# Patient Record
Sex: Male | Born: 1950 | Race: Black or African American | Hispanic: No | Marital: Married | Smoking: Former smoker
Health system: Southern US, Community
[De-identification: ages and names within clinical notes are randomized; demographics above are authoritative.]

## PROBLEM LIST (undated history)

## (undated) DIAGNOSIS — E7800 Pure hypercholesterolemia, unspecified: Secondary | ICD-10-CM

## (undated) DIAGNOSIS — E079 Disorder of thyroid, unspecified: Secondary | ICD-10-CM

## (undated) DIAGNOSIS — E119 Type 2 diabetes mellitus without complications: Secondary | ICD-10-CM

## (undated) DIAGNOSIS — M1990 Unspecified osteoarthritis, unspecified site: Secondary | ICD-10-CM

## (undated) DIAGNOSIS — Z550 Illiteracy and low-level literacy: Secondary | ICD-10-CM

## (undated) DIAGNOSIS — N138 Other obstructive and reflux uropathy: Secondary | ICD-10-CM

## (undated) DIAGNOSIS — I10 Essential (primary) hypertension: Secondary | ICD-10-CM

## (undated) DIAGNOSIS — R252 Cramp and spasm: Secondary | ICD-10-CM

## (undated) DIAGNOSIS — C61 Malignant neoplasm of prostate: Secondary | ICD-10-CM

## (undated) DIAGNOSIS — K219 Gastro-esophageal reflux disease without esophagitis: Secondary | ICD-10-CM

## (undated) DIAGNOSIS — N401 Enlarged prostate with lower urinary tract symptoms: Secondary | ICD-10-CM

## (undated) HISTORY — PX: CYST REMOVAL NECK: SHX6281

## (undated) HISTORY — PX: PROSTATE BIOPSY: SHX241

---

## 2001-06-04 ENCOUNTER — Ambulatory Visit (HOSPITAL_COMMUNITY): Admission: RE | Admit: 2001-06-04 | Discharge: 2001-06-04 | Payer: Self-pay | Admitting: *Deleted

## 2001-06-14 ENCOUNTER — Ambulatory Visit (HOSPITAL_COMMUNITY): Admission: RE | Admit: 2001-06-14 | Discharge: 2001-06-14 | Payer: Self-pay | Admitting: *Deleted

## 2001-06-24 ENCOUNTER — Ambulatory Visit (HOSPITAL_COMMUNITY): Admission: RE | Admit: 2001-06-24 | Discharge: 2001-06-24 | Payer: Self-pay | Admitting: *Deleted

## 2016-07-07 DIAGNOSIS — E119 Type 2 diabetes mellitus without complications: Secondary | ICD-10-CM | POA: Insufficient documentation

## 2016-11-07 ENCOUNTER — Emergency Department (HOSPITAL_COMMUNITY): Payer: Managed Care, Other (non HMO)

## 2016-11-07 ENCOUNTER — Encounter (HOSPITAL_COMMUNITY): Payer: Self-pay

## 2016-11-07 ENCOUNTER — Encounter (HOSPITAL_COMMUNITY): Admission: EM | Disposition: A | Discharge status: Home / Self Care | Payer: Self-pay | Source: Home / Self Care

## 2016-11-07 ENCOUNTER — Emergency Department (HOSPITAL_COMMUNITY): Payer: Managed Care, Other (non HMO) | Admitting: Registered Nurse

## 2016-11-07 ENCOUNTER — Inpatient Hospital Stay (HOSPITAL_COMMUNITY)
Admission: EM | Admit: 2016-11-07 | Discharge: 2016-11-14 | DRG: 339 | Disposition: A | Discharge status: Home / Self Care | Payer: Managed Care, Other (non HMO) | Attending: Surgery | Admitting: Surgery

## 2016-11-07 DIAGNOSIS — K429 Umbilical hernia without obstruction or gangrene: Secondary | ICD-10-CM | POA: Diagnosis present

## 2016-11-07 DIAGNOSIS — E7800 Pure hypercholesterolemia, unspecified: Secondary | ICD-10-CM | POA: Diagnosis present

## 2016-11-07 DIAGNOSIS — K353 Acute appendicitis with localized peritonitis, without perforation or gangrene: Secondary | ICD-10-CM

## 2016-11-07 DIAGNOSIS — K381 Appendicular concretions: Secondary | ICD-10-CM | POA: Diagnosis present

## 2016-11-07 DIAGNOSIS — K3532 Acute appendicitis with perforation and localized peritonitis, without abscess: Secondary | ICD-10-CM | POA: Diagnosis present

## 2016-11-07 DIAGNOSIS — R066 Hiccough: Secondary | ICD-10-CM | POA: Diagnosis not present

## 2016-11-07 DIAGNOSIS — Z79899 Other long term (current) drug therapy: Secondary | ICD-10-CM

## 2016-11-07 DIAGNOSIS — E119 Type 2 diabetes mellitus without complications: Secondary | ICD-10-CM | POA: Diagnosis present

## 2016-11-07 DIAGNOSIS — I10 Essential (primary) hypertension: Secondary | ICD-10-CM | POA: Diagnosis present

## 2016-11-07 DIAGNOSIS — Z87891 Personal history of nicotine dependence: Secondary | ICD-10-CM

## 2016-11-07 DIAGNOSIS — E039 Hypothyroidism, unspecified: Secondary | ICD-10-CM | POA: Diagnosis present

## 2016-11-07 DIAGNOSIS — K567 Ileus, unspecified: Secondary | ICD-10-CM | POA: Diagnosis not present

## 2016-11-07 HISTORY — DX: Pure hypercholesterolemia, unspecified: E78.00

## 2016-11-07 HISTORY — DX: Cramp and spasm: R25.2

## 2016-11-07 HISTORY — PX: LAPAROSCOPIC APPENDECTOMY: SHX408

## 2016-11-07 HISTORY — DX: Disorder of thyroid, unspecified: E07.9

## 2016-11-07 HISTORY — DX: Essential (primary) hypertension: I10

## 2016-11-07 LAB — CBC WITH DIFFERENTIAL/PLATELET
Basophils Absolute: 0 K/uL (ref 0.0–0.1)
Basophils Relative: 0 %
Eosinophils Absolute: 0 K/uL (ref 0.0–0.7)
Eosinophils Relative: 0 %
HCT: 45.6 % (ref 39.0–52.0)
Hemoglobin: 14.9 g/dL (ref 13.0–17.0)
Lymphocytes Relative: 9 %
Lymphs Abs: 1.5 K/uL (ref 0.7–4.0)
MCH: 29.3 pg (ref 26.0–34.0)
MCHC: 32.7 g/dL (ref 30.0–36.0)
MCV: 89.6 fL (ref 78.0–100.0)
Monocytes Absolute: 1.7 K/uL — ABNORMAL HIGH (ref 0.1–1.0)
Monocytes Relative: 10 %
Neutro Abs: 13.6 K/uL — ABNORMAL HIGH (ref 1.7–7.7)
Neutrophils Relative %: 81 %
Platelets: 286 K/uL (ref 150–400)
RBC: 5.09 MIL/uL (ref 4.22–5.81)
RDW: 14.5 % (ref 11.5–15.5)
WBC: 16.9 K/uL — ABNORMAL HIGH (ref 4.0–10.5)

## 2016-11-07 LAB — COMPREHENSIVE METABOLIC PANEL WITH GFR
ALT: 305 U/L — ABNORMAL HIGH (ref 17–63)
AST: 91 U/L — ABNORMAL HIGH (ref 15–41)
Albumin: 4.6 g/dL (ref 3.5–5.0)
Alkaline Phosphatase: 64 U/L (ref 38–126)
Anion gap: 7 (ref 5–15)
BUN: 16 mg/dL (ref 6–20)
CO2: 31 mmol/L (ref 22–32)
Calcium: 9.1 mg/dL (ref 8.9–10.3)
Chloride: 103 mmol/L (ref 101–111)
Creatinine, Ser: 1.01 mg/dL (ref 0.61–1.24)
GFR calc Af Amer: >60 mL/min (ref >60)
GFR calc non Af Amer: >60 mL/min (ref >60)
Glucose, Bld: 155 mg/dL — ABNORMAL HIGH (ref 65–99)
Potassium: 3.8 mmol/L (ref 3.5–5.1)
Sodium: 141 mmol/L (ref 135–145)
Total Bilirubin: 0.8 mg/dL (ref 0.3–1.2)
Total Protein: 7.6 g/dL (ref 6.5–8.1)

## 2016-11-07 LAB — URINALYSIS, ROUTINE W REFLEX MICROSCOPIC
Bacteria, UA: NONE SEEN
Bilirubin Urine: NEGATIVE
Glucose, UA: 50 mg/dL — AB
Ketones, ur: NEGATIVE mg/dL
Leukocytes, UA: NEGATIVE
Nitrite: NEGATIVE
Protein, ur: NEGATIVE mg/dL
Specific Gravity, Urine: 1.019 (ref 1.005–1.030)
pH: 6 (ref 5.0–8.0)

## 2016-11-07 LAB — LIPASE, BLOOD: LIPASE: 14 U/L (ref 11–51)

## 2016-11-07 LAB — TROPONIN I

## 2016-11-07 SURGERY — APPENDECTOMY, LAPAROSCOPIC
Anesthesia: General

## 2016-11-07 MED ORDER — LIDOCAINE 2% (20 MG/ML) 5 ML SYRINGE
INTRAMUSCULAR | Status: DC | PRN
  Administered 2016-11-07: 40 mg via INTRAVENOUS

## 2016-11-07 MED ORDER — DEXAMETHASONE SODIUM PHOSPHATE 10 MG/ML IJ SOLN
INTRAMUSCULAR | Status: AC
  Filled 2016-11-07: qty 1

## 2016-11-07 MED ORDER — LIDOCAINE 2% (20 MG/ML) 5 ML SYRINGE
INTRAMUSCULAR | Status: AC
  Filled 2016-11-07: qty 5

## 2016-11-07 MED ORDER — SUGAMMADEX SODIUM 200 MG/2ML IV SOLN
INTRAVENOUS | Status: DC | PRN
  Administered 2016-11-07: 200 mg via INTRAVENOUS

## 2016-11-07 MED ORDER — DICYCLOMINE HCL 20 MG PO TABS: 20.0000 mg | ORAL_TABLET | Freq: Two times a day (BID) | ORAL | 0 refills | Status: DC

## 2016-11-07 MED ORDER — ONDANSETRON HCL 4 MG/2ML IJ SOLN
4.0000 mg | Freq: Once | INTRAMUSCULAR | Status: AC
  Administered 2016-11-07: 4 mg via INTRAVENOUS
  Filled 2016-11-07: qty 2

## 2016-11-07 MED ORDER — IOPAMIDOL (ISOVUE-300) INJECTION 61%
100.0000 mL | Freq: Once | INTRAVENOUS | Status: AC | PRN
  Administered 2016-11-07: 100 mL via INTRAVENOUS

## 2016-11-07 MED ORDER — ONDANSETRON HCL 4 MG/2ML IJ SOLN
INTRAMUSCULAR | Status: DC | PRN
  Administered 2016-11-07: 4 mg via INTRAVENOUS

## 2016-11-07 MED ORDER — MORPHINE SULFATE (PF) 4 MG/ML IV SOLN
4.0000 mg | Freq: Once | INTRAVENOUS | Status: AC
  Administered 2016-11-07: 4 mg via INTRAVENOUS
  Filled 2016-11-07: qty 1

## 2016-11-07 MED ORDER — MIDAZOLAM HCL 2 MG/2ML IJ SOLN
INTRAMUSCULAR | Status: AC
  Filled 2016-11-07: qty 2

## 2016-11-07 MED ORDER — ONDANSETRON HCL 4 MG/2ML IJ SOLN
4.0000 mg | Freq: Four times a day (QID) | INTRAMUSCULAR | Status: DC | PRN
  Administered 2016-11-08 – 2016-11-11 (×3): 4 mg via INTRAVENOUS
  Filled 2016-11-07 (×3): qty 2

## 2016-11-07 MED ORDER — SUGAMMADEX SODIUM 200 MG/2ML IV SOLN
INTRAVENOUS | Status: AC
  Filled 2016-11-07: qty 2

## 2016-11-07 MED ORDER — IOPAMIDOL (ISOVUE-300) INJECTION 61%
INTRAVENOUS | Status: AC
  Filled 2016-11-07: qty 100

## 2016-11-07 MED ORDER — HYDROMORPHONE HCL 1 MG/ML IJ SOLN
0.2500 mg | INTRAMUSCULAR | Status: DC | PRN
Start: 2016-11-07 — End: 2016-11-07
  Administered 2016-11-07 (×3): 0.5 mg via INTRAVENOUS

## 2016-11-07 MED ORDER — FENTANYL CITRATE (PF) 100 MCG/2ML IJ SOLN
INTRAMUSCULAR | Status: AC
  Filled 2016-11-07: qty 2

## 2016-11-07 MED ORDER — MORPHINE SULFATE (PF) 2 MG/ML IV SOLN
1.0000 mg | INTRAVENOUS | Status: DC | PRN
  Administered 2016-11-08 – 2016-11-10 (×4): 1 mg via INTRAVENOUS
  Filled 2016-11-07 (×5): qty 1

## 2016-11-07 MED ORDER — HYDRALAZINE HCL 20 MG/ML IJ SOLN: 10.0000 mg | INTRAMUSCULAR | Status: DC | PRN

## 2016-11-07 MED ORDER — PIPERACILLIN-TAZOBACTAM 3.375 G IVPB
3.3750 g | Freq: Once | INTRAVENOUS | Status: AC
  Administered 2016-11-07: 3.375 g via INTRAVENOUS
  Filled 2016-11-07: qty 50

## 2016-11-07 MED ORDER — PROPOFOL 10 MG/ML IV BOLUS
INTRAVENOUS | Status: AC
  Filled 2016-11-07: qty 20

## 2016-11-07 MED ORDER — LACTATED RINGERS IV SOLN
INTRAVENOUS | Status: DC
  Administered 2016-11-07: 16:00:00 via INTRAVENOUS

## 2016-11-07 MED ORDER — BUPIVACAINE HCL 0.25 % IJ SOLN
INTRAMUSCULAR | Status: DC | PRN
  Administered 2016-11-07: 10 mL

## 2016-11-07 MED ORDER — KCL IN DEXTROSE-NACL 20-5-0.45 MEQ/L-%-% IV SOLN
INTRAVENOUS | Status: DC
  Administered 2016-11-07 – 2016-11-09 (×2): via INTRAVENOUS
  Filled 2016-11-07 (×4): qty 1000

## 2016-11-07 MED ORDER — SODIUM CHLORIDE 0.9 % IV SOLN
1000.0000 mL | Freq: Once | INTRAVENOUS | Status: AC
  Administered 2016-11-07: 1000 mL via INTRAVENOUS

## 2016-11-07 MED ORDER — FENTANYL CITRATE (PF) 100 MCG/2ML IJ SOLN
INTRAMUSCULAR | Status: DC | PRN
  Administered 2016-11-07: 100 ug via INTRAVENOUS

## 2016-11-07 MED ORDER — PANTOPRAZOLE SODIUM 40 MG IV SOLR
40.0000 mg | Freq: Every day | INTRAVENOUS | Status: DC
  Administered 2016-11-07 – 2016-11-12 (×6): 40 mg via INTRAVENOUS
  Filled 2016-11-07 (×6): qty 40

## 2016-11-07 MED ORDER — ROCURONIUM BROMIDE 10 MG/ML (PF) SYRINGE
PREFILLED_SYRINGE | INTRAVENOUS | Status: DC | PRN
  Administered 2016-11-07: 40 mg via INTRAVENOUS

## 2016-11-07 MED ORDER — PROPOFOL 10 MG/ML IV BOLUS
INTRAVENOUS | Status: DC | PRN
Start: 2016-11-07 — End: 2016-11-07
  Administered 2016-11-07: 150 mg via INTRAVENOUS

## 2016-11-07 MED ORDER — PIPERACILLIN-TAZOBACTAM 3.375 G IVPB: 3.3750 g | Freq: Three times a day (TID) | INTRAVENOUS | Status: DC

## 2016-11-07 MED ORDER — SODIUM CHLORIDE 0.9 % IJ SOLN
INTRAMUSCULAR | Status: AC
  Filled 2016-11-07: qty 50

## 2016-11-07 MED ORDER — ONDANSETRON HCL 4 MG/2ML IJ SOLN
INTRAMUSCULAR | Status: AC
  Filled 2016-11-07: qty 2

## 2016-11-07 MED ORDER — PIPERACILLIN-TAZOBACTAM 3.375 G IVPB
3.3750 g | Freq: Three times a day (TID) | INTRAVENOUS | Status: DC
  Administered 2016-11-07 – 2016-11-14 (×20): 3.375 g via INTRAVENOUS
  Filled 2016-11-07 (×20): qty 50

## 2016-11-07 MED ORDER — ONDANSETRON 4 MG PO TBDP: 4.0000 mg | ORAL_TABLET | Freq: Four times a day (QID) | ORAL | Status: DC | PRN

## 2016-11-07 MED ORDER — SUCCINYLCHOLINE CHLORIDE 200 MG/10ML IV SOSY
PREFILLED_SYRINGE | INTRAVENOUS | Status: DC | PRN
  Administered 2016-11-07: 120 mg via INTRAVENOUS

## 2016-11-07 MED ORDER — HEPARIN SODIUM (PORCINE) 5000 UNIT/ML IJ SOLN
5000.0000 [IU] | Freq: Three times a day (TID) | INTRAMUSCULAR | Status: DC
  Administered 2016-11-08 – 2016-11-14 (×19): 5000 [IU] via SUBCUTANEOUS
  Filled 2016-11-07 (×18): qty 1

## 2016-11-07 MED ORDER — HYDROMORPHONE HCL 2 MG/ML IJ SOLN
INTRAMUSCULAR | Status: AC
  Filled 2016-11-07: qty 1

## 2016-11-07 MED ORDER — MIDAZOLAM HCL 5 MG/5ML IJ SOLN
INTRAMUSCULAR | Status: DC | PRN
  Administered 2016-11-07: 2 mg via INTRAVENOUS

## 2016-11-07 MED ORDER — DEXAMETHASONE SODIUM PHOSPHATE 10 MG/ML IJ SOLN
INTRAMUSCULAR | Status: DC | PRN
  Administered 2016-11-07: 10 mg via INTRAVENOUS

## 2016-11-07 MED ORDER — ONDANSETRON HCL 4 MG PO TABS: 4.0000 mg | ORAL_TABLET | Freq: Four times a day (QID) | ORAL | 0 refills | Status: DC

## 2016-11-07 MED ORDER — ROCURONIUM BROMIDE 50 MG/5ML IV SOSY
PREFILLED_SYRINGE | INTRAVENOUS | Status: AC
  Filled 2016-11-07: qty 5

## 2016-11-07 MED ORDER — SUCCINYLCHOLINE CHLORIDE 200 MG/10ML IV SOSY
PREFILLED_SYRINGE | INTRAVENOUS | Status: AC
  Filled 2016-11-07: qty 10

## 2016-11-07 SURGICAL SUPPLY — 41 items
APPLIER CLIP ROT 10 11.4 M/L (STAPLE)
CABLE HIGH FREQUENCY MONO STRZ (ELECTRODE) ×3 IMPLANT
CHLORAPREP W/TINT 26ML (MISCELLANEOUS) ×3 IMPLANT
CLIP APPLIE ROT 10 11.4 M/L (STAPLE) IMPLANT
COVER SURGICAL LIGHT HANDLE (MISCELLANEOUS) ×3 IMPLANT
CUTTER FLEX LINEAR 45M (STAPLE) ×3 IMPLANT
DECANTER SPIKE VIAL GLASS SM (MISCELLANEOUS) ×3 IMPLANT
DERMABOND ADVANCED (GAUZE/BANDAGES/DRESSINGS) ×2
DERMABOND ADVANCED .7 DNX12 (GAUZE/BANDAGES/DRESSINGS) ×1 IMPLANT
DRAIN CHANNEL 19F RND (DRAIN) ×3 IMPLANT
DRAPE LAPAROSCOPIC ABDOMINAL (DRAPES) ×3 IMPLANT
ELECT REM PT RETURN 9FT ADLT (ELECTROSURGICAL) ×3
ELECTRODE REM PT RTRN 9FT ADLT (ELECTROSURGICAL) ×1 IMPLANT
ENDOLOOP SUT PDS II  0 18 (SUTURE)
ENDOLOOP SUT PDS II 0 18 (SUTURE) IMPLANT
EVACUATOR SILICONE 100CC (DRAIN) ×3 IMPLANT
GLOVE BIOGEL M 8.0 STRL (GLOVE) ×3 IMPLANT
GOWN STRL REUS W/TWL XL LVL3 (GOWN DISPOSABLE) ×6 IMPLANT
IRRIG SUCT STRYKERFLOW 2 WTIP (MISCELLANEOUS) ×3
IRRIGATION SUCT STRKRFLW 2 WTP (MISCELLANEOUS) ×1 IMPLANT
KIT BASIN OR (CUSTOM PROCEDURE TRAY) ×3 IMPLANT
POUCH RETRIEVAL ECOSAC 10 (ENDOMECHANICALS) IMPLANT
POUCH RETRIEVAL ECOSAC 10MM (ENDOMECHANICALS)
POUCH SPECIMEN RETRIEVAL 10MM (ENDOMECHANICALS) ×6 IMPLANT
RELOAD 45 VASCULAR/THIN (ENDOMECHANICALS) IMPLANT
RELOAD STAPLE TA45 3.5 REG BLU (ENDOMECHANICALS) ×3 IMPLANT
SCISSORS LAP 5X45 EPIX DISP (ENDOMECHANICALS) IMPLANT
SHEARS HARMONIC ACE PLUS 45CM (MISCELLANEOUS) ×3 IMPLANT
SLEEVE XCEL OPT CAN 5 100 (ENDOMECHANICALS) IMPLANT
STAPLER VISISTAT 35W (STAPLE) ×3 IMPLANT
SUT NOVA NAB GS-21 1 T12 (SUTURE) ×3 IMPLANT
SUT NYLON 3 0 (SUTURE) ×3 IMPLANT
SUT VIC AB 4-0 SH 18 (SUTURE) ×3 IMPLANT
TOWEL OR 17X26 10 PK STRL BLUE (TOWEL DISPOSABLE) ×3 IMPLANT
TRAY FOLEY W/METER SILVER 14FR (SET/KITS/TRAYS/PACK) IMPLANT
TRAY FOLEY W/METER SILVER 16FR (SET/KITS/TRAYS/PACK) ×3 IMPLANT
TRAY LAPAROSCOPIC (CUSTOM PROCEDURE TRAY) ×3 IMPLANT
TROCAR BLADELESS OPT 5 100 (ENDOMECHANICALS) ×3 IMPLANT
TROCAR XCEL BLUNT TIP 100MML (ENDOMECHANICALS) ×3 IMPLANT
TROCAR XCEL NON-BLD 11X100MML (ENDOMECHANICALS) IMPLANT
TUBING INSUF HEATED (TUBING) ×3 IMPLANT

## 2016-11-07 NOTE — Anesthesia Postprocedure Evaluation (Signed)
Anesthesia Post Note  Patient: Melvin Warner  Procedure(s) Performed: Procedure(s) (LRB): APPENDECTOMY LAPAROSCOPIC (N/A)  Patient location during evaluation: PACU Anesthesia Type: General Level of consciousness: awake Pain management: pain level controlled Respiratory status: spontaneous breathing Cardiovascular status: stable Anesthetic complications: no    Last Vitals:  Vitals:   11/07/16 1830 11/07/16 1845  BP: 108/63 108/62  Pulse: 89 88  Resp: (!) 22 17  Temp:      Last Pain:  Vitals:   11/07/16 1805  TempSrc:   PainSc: Asleep                 EDWARDS,Elva Breaker

## 2016-11-07 NOTE — H&P (Signed)
[facility] Admission Note  Aleem E Glab 07/19/1951  [id number].    Requesting MD: Haviland, MD Chief Complaint/Reason for Consult: acute appendicitis   HPI:  Mr. Blixt is a 65 y/o male with HTN, HLD, and hypothyroidism who presented to [facility] with lower abdominal pain. Patient states the pain started last night and has gradually worsened and localized to his right lower abdomen, causing him to call EMS this morning. Pain described as aching, constant, and non-radiating. Patient reports when the pain started he thought It may have been a pulled muscle from mopping floors. He tried taking some OTC stool softeners with no relief of symptoms. Associated symptoms include nausea and one episode of watery, non-bloody emesis.   Patient denies a PMH MI or CVA. Patient reports that he has been diagnosed with Diabetes but does not take medications, he is managing the disease with carb-modified diet. Has no PMH abdominal surgeries. Denies use of blood thinners. Last meal was yesterday. Has a history of cyst removal from his neck many years ago, during which time he reports "waking up" and sitting up during the operation. He denies alcohol use - used to drink beer regularly but has not had a drink in 4 years. Patient is employed by [employer].  ED workup: WBC 16.9, CT significant for acute appendicitis w/ appendicolith without rupture/abscess, troponin negative, UA negative, AST 91, ALT 305 (no biliary abnormalities on CT scan, suspect related to reported history alcohol overuse, AST to ALT ratio > 2:1)   ROS: Review of Systems  Constitutional: Negative for chills and fever.  HENT: Negative for congestion.   Respiratory: Negative for hemoptysis and shortness of breath.   Cardiovascular: Negative for chest pain and palpitations.  Gastrointestinal: Positive for abdominal pain, nausea and vomiting. Negative for blood in stool, constipation, diarrhea, heartburn and melena.  Genitourinary:  Negative for dysuria and hematuria.  Neurological: Negative for weakness and headaches.     History reviewed. No pertinent family history.  Past Medical History:  Diagnosis Date  . High cholesterol   . Hypertension   . Leg cramps   . Thyroid disease     Past Surgical History:  Procedure Laterality Date  . CYST REMOVAL NECK      Social History:  reports that he has quit smoking. His smoking use included Cigarettes. He has never used smokeless tobacco. He reports that he does not drink alcohol or use drugs.  Allergies: No Known Allergies   (Not in a hospital admission)  Blood pressure 141/76, pulse 104, temperature 98.6 F (37 C), temperature source Oral, resp. rate 20, height 5' 8" (1.727 m), weight 175 lb (79.4 kg), SpO2 95 %. Physical Exam: General: pleasant, WD/WN AA male who is laying in bed in NAD HEENT: head is normocephalic, atraumatic. Mouth is pink and dry Heart: regular, rate, and rhythm.  No obvious murmurs, gallops, or rubs noted.   Lungs: CTAB, no wheezes, rhonchi, or rales noted.  Respiratory effort nonlabored Abd: soft, TTP of right lower abdomen with local peritonitis and positive Rovsing's sign, +BS, reducible umbilical hernia is present MS: all 4 extremities are symmetrical with no cyanosis, clubbing, or edema. Skin: warm and dry with no masses Psych: A&Ox3 with an appropriate affect. Neuro: grossly intact, normal speech  Results for orders placed or performed during the hospital encounter of 11/07/16 (from the past 48 hour(s))  CBC with Differential     Status: Abnormal   Collection Time: 11/07/16  8:44 AM  Result Value Ref Range     WBC 16.9 (H) 4.0 - 10.5 K/uL   RBC 5.09 4.22 - 5.81 MIL/uL   Hemoglobin 14.9 13.0 - 17.0 g/dL   HCT 45.6 39.0 - 52.0 %   MCV 89.6 78.0 - 100.0 fL   MCH 29.3 26.0 - 34.0 pg   MCHC 32.7 30.0 - 36.0 g/dL   RDW 14.5 11.5 - 15.5 %   Platelets 286 150 - 400 K/uL   Neutrophils Relative % 81 %   Neutro Abs 13.6 (H) 1.7 - 7.7  K/uL   Lymphocytes Relative 9 %   Lymphs Abs 1.5 0.7 - 4.0 K/uL   Monocytes Relative 10 %   Monocytes Absolute 1.7 (H) 0.1 - 1.0 K/uL   Eosinophils Relative 0 %   Eosinophils Absolute 0.0 0.0 - 0.7 K/uL   Basophils Relative 0 %   Basophils Absolute 0.0 0.0 - 0.1 K/uL  Comprehensive metabolic panel     Status: Abnormal   Collection Time: 11/07/16  8:44 AM  Result Value Ref Range   Sodium 141 135 - 145 mmol/L   Potassium 3.8 3.5 - 5.1 mmol/L   Chloride 103 101 - 111 mmol/L   CO2 31 22 - 32 mmol/L   Glucose, Bld 155 (H) 65 - 99 mg/dL   BUN 16 6 - 20 mg/dL   Creatinine, Ser 1.01 0.61 - 1.24 mg/dL   Calcium 9.1 8.9 - 10.3 mg/dL   Total Protein 7.6 6.5 - 8.1 g/dL   Albumin 4.6 3.5 - 5.0 g/dL   AST 91 (H) 15 - 41 U/L   ALT 305 (H) 17 - 63 U/L   Alkaline Phosphatase 64 38 - 126 U/L   Total Bilirubin 0.8 0.3 - 1.2 mg/dL   GFR calc non Af Amer >60 >60 mL/min   GFR calc Af Amer >60 >60 mL/min    Comment: (NOTE) The eGFR has been calculated using the CKD EPI equation. This calculation has not been validated in all clinical situations. eGFR's persistently <60 mL/min signify possible Chronic Kidney Disease.    Anion gap 7 5 - 15  Lipase, blood     Status: None   Collection Time: 11/07/16  8:44 AM  Result Value Ref Range   Lipase 14 11 - 51 U/L  Troponin I     Status: None   Collection Time: 11/07/16  8:44 AM  Result Value Ref Range   Troponin I <0.03 <0.03 ng/mL  Urinalysis, Routine w reflex microscopic     Status: Abnormal   Collection Time: 11/07/16  8:44 AM  Result Value Ref Range   Color, Urine YELLOW YELLOW   APPearance CLEAR CLEAR   Specific Gravity, Urine 1.019 1.005 - 1.030   pH 6.0 5.0 - 8.0   Glucose, UA 50 (A) NEGATIVE mg/dL   Hgb urine dipstick SMALL (A) NEGATIVE   Bilirubin Urine NEGATIVE NEGATIVE   Ketones, ur NEGATIVE NEGATIVE mg/dL   Protein, ur NEGATIVE NEGATIVE mg/dL   Nitrite NEGATIVE NEGATIVE   Leukocytes, UA NEGATIVE NEGATIVE   RBC / HPF 6-30 0 - 5  RBC/hpf   WBC, UA 0-5 0 - 5 WBC/hpf   Bacteria, UA NONE SEEN NONE SEEN   Squamous Epithelial / LPF 0-5 (A) NONE SEEN   Mucous PRESENT    Ct Abdomen Pelvis W Contrast  Result Date: 11/07/2016 CLINICAL DATA:  65-year-old male with lower abdominal pain and cramping since last night. Abdominal distension. Initial encounter. EXAM: CT ABDOMEN AND PELVIS WITH CONTRAST TECHNIQUE: Multidetector CT imaging of the abdomen and   pelvis was performed using the standard protocol following bolus administration of intravenous contrast. CONTRAST:  100mL ISOVUE-300 IOPAMIDOL (ISOVUE-300) INJECTION 61% COMPARISON:  None. FINDINGS: Lower chest: Mild lung base atelectasis. No pericardial or pleural effusion. No free air in the upper abdomen. Hepatobiliary: Negative. Pancreas: Negative. Spleen: Negative. Adrenals/Urinary Tract: Negative adrenal glands. Normal bilateral renal enhancement and contrast excretion. Unremarkable urinary bladder. Stomach/Bowel: Negative rectum. Negative distal sigmoid colon. Mesenteric inflammation in the region of the proximal sigmoid with no direct involvement (see right colon findings below). Mild sigmoid redundancy. Negative left colon and transverse colon aside from retained stool. Gas and retained stool in the right colon. The cecum is dilated up to 8 cm. There is an 8-9 mm appendicolith at the orifice of the appendix from the cecum (series 2, image 53 and coronal image 58). The appendix is then severely dilated to 17 mm diameter and inflamed throughout its course with surrounding mesenteric stranding. The appendix is relatively situated in the midline. There is probably a second 8-9 mm appendicolith in the midportion of the appendix (coronal image 70). No extraluminal gas identified. The terminal ileum is fluid-filled but not significantly dilated and otherwise unaffected. No discrete free fluid. No dilated upstream small bowel. Fairly decompressed stomach. Negative duodenum. Vascular/Lymphatic:  Aortoiliac calcified atherosclerosis noted. Major arterial structures are patent. Portal venous system is patent. No lymphadenopathy; maximal nonspecific gastrohepatic ligament nodes measuring 8 mm short axis. Reproductive: Negative. Other: No pelvic free fluid. Musculoskeletal: Lower lumbar disc, endplate, and facet degeneration. No acute osseous abnormality identified. IMPRESSION: 1. Positive for Acute Appendicitis. Dilated and inflamed appendix with multiple 8-9 mm appendicolith thus. No abscess. No evidence of rupture at this time. 2.  Calcified aortic atherosclerosis. Electronically Signed   By: H  Hall M.D.   On: 11/07/2016 10:35      Assessment/Plan Acute appendicitis without perforation or abscess Leukocytosis  Admit to [facility] service   NPO, IVF, antiemeics, analgesics  WBC 16.9 - IV Zosyn  OR tonight for laparoscopic appendectomy by Dr. Matthew Martin  Abnormal AST/ALT - will discuss role for checking hepatitis panel with MD  Hypertension - hold lisinopril-HCTZ Hyperlipidemia - hold atorvastatin Hypothyroidism - hold synthroid  Dispo: OR for laparoscopic appendectomy Resume home meds post-operatively  Elizabeth S Simaan, PA-C [facility] 11/07/2016, 2:56 PM Pager: [phone] Consults: [phone] Mon-Fri 7:00 am-4:30 pm Sat-Sun 7:00 am-11:30 am  

## 2016-11-07 NOTE — ED Notes (Signed)
Dentures (upper and lower), glasses, and cell phone in security lock box.

## 2016-11-07 NOTE — Op Note (Signed)
Surgeon: Kaylyn Lim, MD, FACS  Asst:  None  Anes:  Gen. endotracheal  Procedure: Laparoscopic appendectomy and primary closure of umbilical hernia  Diagnosis: Perforated appendicitis with fecal contamination and purulent peritonitis  Complications: None  EBL:   8 cc  Drains: 19 Blake drain to right upper quadrant incision  Description of Procedure:  The patient was taken to OR two  at [facility].  After anesthesia was administered and the patient was prepped a timeout was performed.  The patient had a quarter-sized umbilical hernia through which I made my [place] port. This was closed primarily at the end of the case with interrupted #1 Novafils. Upon making the initial incision in the skin yellow pus spewed out of the little hole indicating that he had a perforation with peritonitis. He Sheryle Hail was inserted and the abdomen was insufflated.  The patient had an  appendicolith had ruptured resulting in free feculent stool balls in the right lower quadrant. These were after my 2 ports were placed these were retrieved and placed into a bag and also the 10 mm "pooper" scooper was used to extract remainders.  I was able to divide the mesentery with the Harmonic scalpel and then transected the base with a stapler using a blue load. The appendix was placed in Endobag brought to the umbilicus. A 19 Blake drain was brought into the right side 5 mm port placed in the pelvis. The umbilical incision was closed with #1 Novafils and then the abdomen was surveyed again with the 5 mm scope to the left lower quadrant. Port sites were injected with Marcaine and the umbilicus was closed with 4-0 Vicryl as was the right of the left lower quadrant trocar site.  The patient tolerated the procedure well and was taken to the PACU in stable condition.     Matt B. Hassell Done, [place], [facility], [place]

## 2016-11-07 NOTE — ED Provider Notes (Addendum)
[facility] DEPT Provider Note   CSN: [id number] Arrival date & time: 11/07/16  [id number]     History   Chief Complaint Chief Complaint  Patient presents with  . Abdominal Pain  . Constipation    HPI Melvin Warner is a 65 y.o. male.  Pt presents to the ED this morning with bilateral lower abdominal pain that started last night.  He has not had any n/v.  He has been having bowel movements.  The pt feels like his abdomen is full.  He was up all last night with the pain.      Past Medical History:  Diagnosis Date  . High cholesterol   . Hypertension   . Leg cramps   . Thyroid disease     There are no active problems to display for this patient.   Past Surgical History:  Procedure Laterality Date  . CYST REMOVAL NECK         Home Medications    Prior to Admission medications   Medication Sig Start Date End Date Taking? Authorizing Provider  atorvastatin (LIPITOR) 20 MG tablet Take 20 mg by mouth daily.   Yes Historical Provider, MD  diclofenac (VOLTAREN) 75 MG EC tablet Take 75 mg by mouth 2 (two) times daily as needed for moderate pain.   Yes Historical Provider, MD  levothyroxine (SYNTHROID, LEVOTHROID) 100 MCG tablet Take 100 mcg by mouth daily before breakfast.   Yes Historical Provider, MD  lisinopril-hydrochlorothiazide (PRINZIDE,ZESTORETIC) 20-12.5 MG tablet Take 1 tablet by mouth daily.   Yes Historical Provider, MD  Multiple Vitamins-Minerals (MENS ONE DAILY PO) Take 1 tablet by mouth daily.   Yes Historical Provider, MD  nystatin cream (MYCOSTATIN) Apply 1 application topically 2 (two) times daily as needed for dry skin.   Yes Historical Provider, MD  oxymetazoline (AFRIN) 0.05 % nasal spray Place 1 spray into both nostrils 2 (two) times daily as needed for congestion.   Yes Historical Provider, MD    Family History History reviewed. No pertinent family history.  Social History Social History  Substance Use Topics  . Smoking status: Former Smoker     Types: Cigarettes  . Smokeless tobacco: Never Used  . Alcohol use No     Allergies   Patient has no known allergies.   Review of Systems Review of Systems  Gastrointestinal: Positive for abdominal distention and abdominal pain.  All other systems reviewed and are negative.    Physical Exam Updated Vital Signs BP (!) 122/53   Pulse 98   Temp 98.6 F (37 C) (Oral)   Resp (!) 27   Ht 5\' 8"  (1.727 m)   Wt 175 lb (79.4 kg)   SpO2 91%   BMI 26.61 kg/m   Physical Exam  Constitutional: He appears well-developed and well-nourished.  HENT:  Head: Normocephalic and atraumatic.  Right Ear: External ear normal.  Left Ear: External ear normal.  Nose: Nose normal.  Mouth/Throat: Oropharynx is clear and moist.  Eyes: Conjunctivae and EOM are normal. Pupils are equal, round, and reactive to light.  Neck: Normal range of motion. Neck supple.  Cardiovascular: Normal rate, regular rhythm, normal heart sounds and intact distal pulses.   Pulmonary/Chest: Effort normal and breath sounds normal.  Abdominal: Soft. He exhibits distension. There is tenderness in the right lower quadrant, suprapubic area and left lower quadrant.  Musculoskeletal: Normal range of motion.  Neurological: He is alert.  Skin: Skin is warm.  Psychiatric: He has a normal mood and affect. His  behavior is normal. Judgment and thought content normal.  Nursing note and vitals reviewed.    ED Treatments / Results  Labs (all labs ordered are listed, but only abnormal results are displayed) Labs Reviewed  CBC WITH DIFFERENTIAL/PLATELET - Abnormal; Notable for the following:       Result Value   WBC 16.9 (*)    Neutro Abs 13.6 (*)    Monocytes Absolute 1.7 (*)    All other components within normal limits  COMPREHENSIVE METABOLIC PANEL - Abnormal; Notable for the following:    Glucose, Bld 155 (*)    AST 91 (*)    ALT 305 (*)    All other components within normal limits  URINALYSIS, ROUTINE W REFLEX  MICROSCOPIC - Abnormal; Notable for the following:    Glucose, UA 50 (*)    Hgb urine dipstick SMALL (*)    Squamous Epithelial / LPF 0-5 (*)    All other components within normal limits  LIPASE, BLOOD  TROPONIN I    EKG  EKG Interpretation  Date/Time:  Tuesday November 07 2016 08:44:29 EST Ventricular Rate:  97 PR Interval:    QRS Duration: 86 QT Interval:  344 QTC Calculation: 437 R Axis:   69 Text Interpretation:  Sinus rhythm Baseline wander in lead(s) V5 Confirmed by Airlie Blumenberg MD, Randeep Biondolillo ([id number]) on 11/07/2016 9:03:17 AM       Radiology Ct Abdomen Pelvis W Contrast  Result Date: 11/07/2016 CLINICAL DATA:  65 year old male with lower abdominal pain and cramping since last night. Abdominal distension. Initial encounter. EXAM: CT ABDOMEN AND PELVIS WITH CONTRAST TECHNIQUE: Multidetector CT imaging of the abdomen and pelvis was performed using the standard protocol following bolus administration of intravenous contrast. CONTRAST:  169mL ISOVUE-300 IOPAMIDOL (ISOVUE-300) INJECTION 61% COMPARISON:  None. FINDINGS: Lower chest: Mild lung base atelectasis. No pericardial or pleural effusion. No free air in the upper abdomen. Hepatobiliary: Negative. Pancreas: Negative. Spleen: Negative. Adrenals/Urinary Tract: Negative adrenal glands. Normal bilateral renal enhancement and contrast excretion. Unremarkable urinary bladder. Stomach/Bowel: Negative rectum. Negative distal sigmoid colon. Mesenteric inflammation in the region of the proximal sigmoid with no direct involvement (see right colon findings below). Mild sigmoid redundancy. Negative left colon and transverse colon aside from retained stool. Gas and retained stool in the right colon. The cecum is dilated up to 8 cm. There is an 8-9 mm appendicolith at the orifice of the appendix from the cecum (series 2, image 53 and coronal image 58). The appendix is then severely dilated to 17 mm diameter and inflamed throughout its course with surrounding  mesenteric stranding. The appendix is relatively situated in the midline. There is probably a second 8-9 mm appendicolith in the midportion of the appendix (coronal image 70). No extraluminal gas identified. The terminal ileum is fluid-filled but not significantly dilated and otherwise unaffected. No discrete free fluid. No dilated upstream small bowel. Fairly decompressed stomach. Negative duodenum. Vascular/Lymphatic: Aortoiliac calcified atherosclerosis noted. Major arterial structures are patent. Portal venous system is patent. No lymphadenopathy; maximal nonspecific gastrohepatic ligament nodes measuring 8 mm short axis. Reproductive: Negative. Other: No pelvic free fluid. Musculoskeletal: Lower lumbar disc, endplate, and facet degeneration. No acute osseous abnormality identified. IMPRESSION: 1. Positive for Acute Appendicitis. Dilated and inflamed appendix with multiple 8-9 mm appendicolith thus. No abscess. No evidence of rupture at this time. 2.  Calcified aortic atherosclerosis. Electronically Signed   By: Genevie Ann M.D.   On: 11/07/2016 10:35    Procedures Procedures (including critical care time)  Medications Ordered  in ED Medications  iopamidol (ISOVUE-300) 61 % injection (not administered)  sodium chloride 0.9 % injection (not administered)  0.9 %  sodium chloride infusion (1,000 mLs Intravenous New Bag/Given 11/07/16 0850)  morphine 4 MG/ML injection 4 mg (4 mg Intravenous Given 11/07/16 0852)  ondansetron (ZOFRAN) injection 4 mg (4 mg Intravenous Given 11/07/16 0852)  iopamidol (ISOVUE-300) 61 % injection 100 mL (100 mLs Intravenous Contrast Given 11/07/16 1003)     Initial Impression / Assessment and Plan / ED Course  I have reviewed the triage vital signs and the nursing notes.  Pertinent labs & imaging results that were available during my care of the patient were reviewed by me and considered in my medical decision making (see chart for details).  Clinical Course    Pt d/w  surgery for admission.  They will come and evaluate pt and likely take him to the or to remove his appendix.  Final Clinical Impressions(s) / ED Diagnoses   Final diagnoses:  Acute appendicitis with localized peritonitis    New Prescriptions Current Discharge Medication List       Isla Pence, MD 11/07/16 1342    Isla Pence, MD 11/07/16 1430

## 2016-11-07 NOTE — Transfer of Care (Signed)
Immediate Anesthesia Transfer of Care Note  Patient: Melvin Warner  Procedure(s) Performed: Procedure(s): APPENDECTOMY LAPAROSCOPIC (N/A)  Patient Location: PACU  Anesthesia Type:General  Level of Consciousness:  sedated, patient cooperative and responds to stimulation  Airway & Oxygen Therapy:Patient Spontanous Breathing and Patient connected to face mask oxgen  Post-op Assessment:  Report given to PACU RN and Post -op Vital signs reviewed and stable  Post vital signs:  Reviewed and stable  Last Vitals:  Vitals:   11/07/16 1300 11/07/16 1400  BP: (!) 122/53 141/76  Pulse: 98 104  Resp: (!) 27 20  Temp:      Complications: No apparent anesthesia complications

## 2016-11-07 NOTE — Anesthesia Preprocedure Evaluation (Signed)
Anesthesia Evaluation  Patient identified by MRN, date of birth, ID band Patient awake    Reviewed: Allergy & Precautions, NPO status , Patient's Chart, lab work & pertinent test results  Airway Mallampati: II  TM Distance: >3 FB     Dental   Pulmonary former smoker,    breath sounds clear to auscultation       Cardiovascular hypertension,  Rhythm:Regular Rate:Normal     Neuro/Psych    GI/Hepatic Neg liver ROS, GI history noted. CE   Endo/Other  negative endocrine ROS  Renal/GU negative Renal ROS     Musculoskeletal   Abdominal   Peds  Hematology   Anesthesia Other Findings   Reproductive/Obstetrics                             Anesthesia Physical Anesthesia Plan  ASA: III  Anesthesia Plan: General   Post-op Pain Management:    Induction: Intravenous  Airway Management Planned: Oral ETT  Additional Equipment:   Intra-op Plan:   Post-operative Plan: Extubation in OR  Informed Consent: I have reviewed the patients History and Physical, chart, labs and discussed the procedure including the risks, benefits and alternatives for the proposed anesthesia with the patient or authorized representative who has indicated his/her understanding and acceptance.   Dental advisory given  Plan Discussed with: CRNA and Anesthesiologist  Anesthesia Plan Comments:         Anesthesia Quick Evaluation

## 2016-11-07 NOTE — ED Notes (Signed)
Bed: [id number] Expected date:  Expected time:  Means of arrival:  Comments: EMS- 65yo M, abdominal pain/constipation?

## 2016-11-07 NOTE — ED Triage Notes (Signed)
Per EMS- Patient c/o bilateral lower abdominal cramping since last night. Patient reported that he had a BM yesterday, but was not normal. Patient has distention of lower abdomen.

## 2016-11-07 NOTE — Anesthesia Procedure Notes (Signed)
Procedure Name: Intubation Date/Time: 11/07/2016 4:20 PM Performed by: Lajuana Carry E Pre-anesthesia Checklist: Patient identified, Emergency Drugs available, Suction available and Patient being monitored Patient Re-evaluated:Patient Re-evaluated prior to inductionOxygen Delivery Method: Circle system utilized Preoxygenation: Pre-oxygenation with 100% oxygen Intubation Type: IV induction, Rapid sequence and Cricoid Pressure applied Ventilation: Mask ventilation without difficulty Laryngoscope Size: Mac and 3 Grade View: Grade I Tube type: Oral Tube size: 7.5 mm Number of attempts: 1 Airway Equipment and Method: Stylet and Oral airway Placement Confirmation: ETT inserted through vocal cords under direct vision,  positive ETCO2 and breath sounds checked- equal and bilateral Secured at: 22 cm Tube secured with: Tape Dental Injury: Teeth and Oropharynx as per pre-operative assessment

## 2016-11-08 ENCOUNTER — Encounter (HOSPITAL_COMMUNITY): Payer: Self-pay | Admitting: Surgery

## 2016-11-08 DIAGNOSIS — K353 Acute appendicitis with localized peritonitis: Secondary | ICD-10-CM | POA: Diagnosis present

## 2016-11-08 DIAGNOSIS — E7800 Pure hypercholesterolemia, unspecified: Secondary | ICD-10-CM | POA: Diagnosis present

## 2016-11-08 DIAGNOSIS — K429 Umbilical hernia without obstruction or gangrene: Secondary | ICD-10-CM | POA: Diagnosis present

## 2016-11-08 DIAGNOSIS — E119 Type 2 diabetes mellitus without complications: Secondary | ICD-10-CM | POA: Diagnosis present

## 2016-11-08 DIAGNOSIS — Z79899 Other long term (current) drug therapy: Secondary | ICD-10-CM | POA: Diagnosis not present

## 2016-11-08 DIAGNOSIS — K381 Appendicular concretions: Secondary | ICD-10-CM | POA: Diagnosis present

## 2016-11-08 DIAGNOSIS — Z87891 Personal history of nicotine dependence: Secondary | ICD-10-CM | POA: Diagnosis not present

## 2016-11-08 DIAGNOSIS — I10 Essential (primary) hypertension: Secondary | ICD-10-CM | POA: Diagnosis present

## 2016-11-08 DIAGNOSIS — R066 Hiccough: Secondary | ICD-10-CM | POA: Diagnosis not present

## 2016-11-08 DIAGNOSIS — E039 Hypothyroidism, unspecified: Secondary | ICD-10-CM | POA: Diagnosis present

## 2016-11-08 DIAGNOSIS — K567 Ileus, unspecified: Secondary | ICD-10-CM | POA: Diagnosis not present

## 2016-11-08 LAB — CBC
HEMATOCRIT: 42.9 % (ref 39.0–52.0)
Hemoglobin: 13.9 g/dL (ref 13.0–17.0)
MCH: 28.9 pg (ref 26.0–34.0)
MCHC: 32.4 g/dL (ref 30.0–36.0)
MCV: 89.2 fL (ref 78.0–100.0)
PLATELETS: 255 10*3/uL (ref 150–400)
RBC: 4.81 MIL/uL (ref 4.22–5.81)
RDW: 15.1 % (ref 11.5–15.5)
WBC: 15.2 10*3/uL — AB (ref 4.0–10.5)

## 2016-11-08 LAB — BASIC METABOLIC PANEL
ANION GAP: 8 (ref 5–15)
BUN: 20 mg/dL (ref 6–20)
CALCIUM: 8.7 mg/dL — AB (ref 8.9–10.3)
CO2: 27 mmol/L (ref 22–32)
Chloride: 102 mmol/L (ref 101–111)
Creatinine, Ser: 1.07 mg/dL (ref 0.61–1.24)
Glucose, Bld: 189 mg/dL — ABNORMAL HIGH (ref 65–99)
POTASSIUM: 4.4 mmol/L (ref 3.5–5.1)
Sodium: 137 mmol/L (ref 135–145)

## 2016-11-08 LAB — GLUCOSE, CAPILLARY: Glucose-Capillary: 122 mg/dL — ABNORMAL HIGH (ref 65–99)

## 2016-11-08 NOTE — Progress Notes (Signed)
Patient ID: Melvin Warner, male   DOB: 05-06-1951, 65 y.o.   MRN: [id number]  [facility] Progress Note:   1 Day Post-Op  Subjective: Mental status is clear.  More alert today.  Appreciative that he is feeling better Objective: Vital signs in last 24 hours: Temp:  [98.5 F (36.9 C)-99.9 F (37.7 C)] 99 F (37.2 C) (12/06 0428) Pulse Rate:  [86-107] 87 (12/06 0428) Resp:  [12-30] 18 (12/06 0428) BP: (108-142)/(53-76) 112/63 (12/06 0428) SpO2:  [91 %-100 %] 97 % (12/06 0428) Weight:  [77.2 kg (170 lb 3.1 oz)] 77.2 kg (170 lb 3.1 oz) (12/05 1919)  Intake/Output from previous day: 12/05 0701 - 12/06 0700 In: 8828 [P.O.:250; I.V.:1270; IV Piggyback:50] Out: 3 [Urine:850; Drains:40; Blood:8] Intake/Output this shift: Total I/O In: -  Out: 250 [Urine:250]  Physical Exam: Work of breathing is not labored.  Incisions OK.  JP drain in place  Lab Results:  Results for orders placed or performed during the hospital encounter of 11/07/16 (from the past 48 hour(s))  CBC with Differential     Status: Abnormal   Collection Time: 11/07/16  8:44 AM  Result Value Ref Range   WBC 16.9 (H) 4.0 - 10.5 K/uL   RBC 5.09 4.22 - 5.81 MIL/uL   Hemoglobin 14.9 13.0 - 17.0 g/dL   HCT 45.6 39.0 - 52.0 %   MCV 89.6 78.0 - 100.0 fL   MCH 29.3 26.0 - 34.0 pg   MCHC 32.7 30.0 - 36.0 g/dL   RDW 14.5 11.5 - 15.5 %   Platelets 286 150 - 400 K/uL   Neutrophils Relative % 81 %   Neutro Abs 13.6 (H) 1.7 - 7.7 K/uL   Lymphocytes Relative 9 %   Lymphs Abs 1.5 0.7 - 4.0 K/uL   Monocytes Relative 10 %   Monocytes Absolute 1.7 (H) 0.1 - 1.0 K/uL   Eosinophils Relative 0 %   Eosinophils Absolute 0.0 0.0 - 0.7 K/uL   Basophils Relative 0 %   Basophils Absolute 0.0 0.0 - 0.1 K/uL  Comprehensive metabolic panel     Status: Abnormal   Collection Time: 11/07/16  8:44 AM  Result Value Ref Range   Sodium 141 135 - 145 mmol/L   Potassium 3.8 3.5 - 5.1 mmol/L   Chloride 103 101 - 111 mmol/L   CO2 31  22 - 32 mmol/L   Glucose, Bld 155 (H) 65 - 99 mg/dL   BUN 16 6 - 20 mg/dL   Creatinine, Ser 1.01 0.61 - 1.24 mg/dL   Calcium 9.1 8.9 - 10.3 mg/dL   Total Protein 7.6 6.5 - 8.1 g/dL   Albumin 4.6 3.5 - 5.0 g/dL   AST 91 (H) 15 - 41 U/L   ALT 305 (H) 17 - 63 U/L   Alkaline Phosphatase 64 38 - 126 U/L   Total Bilirubin 0.8 0.3 - 1.2 mg/dL   GFR calc non Af Amer >60 >60 mL/min   GFR calc Af Amer >60 >60 mL/min    Comment: (NOTE) The eGFR has been calculated using the CKD EPI equation. This calculation has not been validated in all clinical situations. eGFR's persistently <60 mL/min signify possible Chronic Kidney Disease.    Anion gap 7 5 - 15  Lipase, blood     Status: None   Collection Time: 11/07/16  8:44 AM  Result Value Ref Range   Lipase 14 11 - 51 U/L  Troponin I     Status: None  Collection Time: 11/07/16  8:44 AM  Result Value Ref Range   Troponin I <0.03 <0.03 ng/mL  Urinalysis, Routine w reflex microscopic     Status: Abnormal   Collection Time: 11/07/16  8:44 AM  Result Value Ref Range   Color, Urine YELLOW YELLOW   APPearance CLEAR CLEAR   Specific Gravity, Urine 1.019 1.005 - 1.030   pH 6.0 5.0 - 8.0   Glucose, UA 50 (A) NEGATIVE mg/dL   Hgb urine dipstick SMALL (A) NEGATIVE   Bilirubin Urine NEGATIVE NEGATIVE   Ketones, ur NEGATIVE NEGATIVE mg/dL   Protein, ur NEGATIVE NEGATIVE mg/dL   Nitrite NEGATIVE NEGATIVE   Leukocytes, UA NEGATIVE NEGATIVE   RBC / HPF 6-30 0 - 5 RBC/hpf   WBC, UA 0-5 0 - 5 WBC/hpf   Bacteria, UA NONE SEEN NONE SEEN   Squamous Epithelial / LPF 0-5 (A) NONE SEEN   Mucous PRESENT   CBC     Status: Abnormal   Collection Time: 11/08/16  5:20 AM  Result Value Ref Range   WBC 15.2 (H) 4.0 - 10.5 K/uL   RBC 4.81 4.22 - 5.81 MIL/uL   Hemoglobin 13.9 13.0 - 17.0 g/dL   HCT 42.9 39.0 - 52.0 %   MCV 89.2 78.0 - 100.0 fL   MCH 28.9 26.0 - 34.0 pg   MCHC 32.4 30.0 - 36.0 g/dL   RDW 15.1 11.5 - 15.5 %   Platelets 255 150 - 400 K/uL  Basic  metabolic panel     Status: Abnormal   Collection Time: 11/08/16  5:20 AM  Result Value Ref Range   Sodium 137 135 - 145 mmol/L   Potassium 4.4 3.5 - 5.1 mmol/L   Chloride 102 101 - 111 mmol/L   CO2 27 22 - 32 mmol/L   Glucose, Bld 189 (H) 65 - 99 mg/dL   BUN 20 6 - 20 mg/dL   Creatinine, Ser 1.07 0.61 - 1.24 mg/dL   Calcium 8.7 (L) 8.9 - 10.3 mg/dL   GFR calc non Af Amer >60 >60 mL/min   GFR calc Af Amer >60 >60 mL/min    Comment: (NOTE) The eGFR has been calculated using the CKD EPI equation. This calculation has not been validated in all clinical situations. eGFR's persistently <60 mL/min signify possible Chronic Kidney Disease.    Anion gap 8 5 - 15    Radiology/Results: Ct Abdomen Pelvis W Contrast  Result Date: 11/07/2016 CLINICAL DATA:  65 year old male with lower abdominal pain and cramping since last night. Abdominal distension. Initial encounter. EXAM: CT ABDOMEN AND PELVIS WITH CONTRAST TECHNIQUE: Multidetector CT imaging of the abdomen and pelvis was performed using the standard protocol following bolus administration of intravenous contrast. CONTRAST:  172m ISOVUE-300 IOPAMIDOL (ISOVUE-300) INJECTION 61% COMPARISON:  None. FINDINGS: Lower chest: Mild lung base atelectasis. No pericardial or pleural effusion. No free air in the upper abdomen. Hepatobiliary: Negative. Pancreas: Negative. Spleen: Negative. Adrenals/Urinary Tract: Negative adrenal glands. Normal bilateral renal enhancement and contrast excretion. Unremarkable urinary bladder. Stomach/Bowel: Negative rectum. Negative distal sigmoid colon. Mesenteric inflammation in the region of the proximal sigmoid with no direct involvement (see right colon findings below). Mild sigmoid redundancy. Negative left colon and transverse colon aside from retained stool. Gas and retained stool in the right colon. The cecum is dilated up to 8 cm. There is an 8-9 mm appendicolith at the orifice of the appendix from the cecum (series 2,  image 53 and coronal image 58). The appendix is then severely  dilated to 17 mm diameter and inflamed throughout its course with surrounding mesenteric stranding. The appendix is relatively situated in the midline. There is probably a second 8-9 mm appendicolith in the midportion of the appendix (coronal image 70). No extraluminal gas identified. The terminal ileum is fluid-filled but not significantly dilated and otherwise unaffected. No discrete free fluid. No dilated upstream small bowel. Fairly decompressed stomach. Negative duodenum. Vascular/Lymphatic: Aortoiliac calcified atherosclerosis noted. Major arterial structures are patent. Portal venous system is patent. No lymphadenopathy; maximal nonspecific gastrohepatic ligament nodes measuring 8 mm short axis. Reproductive: Negative. Other: No pelvic free fluid. Musculoskeletal: Lower lumbar disc, endplate, and facet degeneration. No acute osseous abnormality identified. IMPRESSION: 1. Positive for Acute Appendicitis. Dilated and inflamed appendix with multiple 8-9 mm appendicolith thus. No abscess. No evidence of rupture at this time. 2.  Calcified aortic atherosclerosis. Electronically Signed   By: Genevie Ann M.D.   On: 11/07/2016 10:35    Anti-infectives: Anti-infectives    Start     Dose/Rate Route Frequency Ordered Stop   11/07/16 2300  piperacillin-tazobactam (ZOSYN) IVPB 3.375 g     3.375 g 12.5 mL/hr over 240 Minutes Intravenous Every 8 hours 11/07/16 1924     11/07/16 1500  piperacillin-tazobactam (ZOSYN) IVPB 3.375 g  Status:  Discontinued     3.375 g 12.5 mL/hr over 240 Minutes Intravenous Every 8 hours 11/07/16 1455 11/07/16 1924   11/07/16 1415  piperacillin-tazobactam (ZOSYN) IVPB 3.375 g     3.375 g 12.5 mL/hr over 240 Minutes Intravenous  Once 11/07/16 1402 11/07/16 1835      Assessment/Plan: Problem List: Patient Active Problem List   Diagnosis Date Noted  . Ruptured suppurative appendicitis Dec 2017 11/07/2016    Postop  ruptured appendix with feculent peritonitis.   1 Day Post-Op    LOS: 0 days   Matt B. Hassell Done, MD, [facility] [phone] beeper [phone]  11/08/2016 10:09 AM

## 2016-11-09 LAB — CBC
HEMATOCRIT: 48.2 % (ref 39.0–52.0)
Hemoglobin: 16.2 g/dL (ref 13.0–17.0)
MCH: 30.1 pg (ref 26.0–34.0)
MCHC: 33.6 g/dL (ref 30.0–36.0)
MCV: 89.4 fL (ref 78.0–100.0)
PLATELETS: 316 10*3/uL (ref 150–400)
RBC: 5.39 MIL/uL (ref 4.22–5.81)
RDW: 15 % (ref 11.5–15.5)
WBC: 16.1 10*3/uL — AB (ref 4.0–10.5)

## 2016-11-09 LAB — GLUCOSE, CAPILLARY: GLUCOSE-CAPILLARY: 165 mg/dL — AB (ref 65–99)

## 2016-11-09 LAB — LIPID PANEL
Cholesterol: 138 mg/dL (ref 0–200)
HDL: 40 mg/dL — AB (ref >40)
LDL CALC: 65 mg/dL (ref 0–99)
TRIGLYCERIDES: 166 mg/dL — AB (ref <150)
Total CHOL/HDL Ratio: 3.5 RATIO
VLDL: 33 mg/dL (ref 0–40)

## 2016-11-09 LAB — BASIC METABOLIC PANEL
ANION GAP: 12 (ref 5–15)
BUN: 23 mg/dL — ABNORMAL HIGH (ref 6–20)
CALCIUM: 9 mg/dL (ref 8.9–10.3)
CO2: 22 mmol/L (ref 22–32)
CREATININE: 1.09 mg/dL (ref 0.61–1.24)
Chloride: 100 mmol/L — ABNORMAL LOW (ref 101–111)
GLUCOSE: 143 mg/dL — AB (ref 65–99)
Potassium: 4.4 mmol/L (ref 3.5–5.1)
Sodium: 134 mmol/L — ABNORMAL LOW (ref 135–145)

## 2016-11-09 MED ORDER — METOCLOPRAMIDE HCL 5 MG/ML IJ SOLN: 10.0000 mg | Freq: Four times a day (QID) | INTRAMUSCULAR | Status: DC | PRN

## 2016-11-09 MED ORDER — KETOROLAC TROMETHAMINE 15 MG/ML IJ SOLN
15.0000 mg | Freq: Four times a day (QID) | INTRAMUSCULAR | Status: AC
  Administered 2016-11-09 – 2016-11-10 (×4): 15 mg via INTRAVENOUS
  Filled 2016-11-09 (×4): qty 1

## 2016-11-09 MED ORDER — METOCLOPRAMIDE HCL 5 MG/ML IJ SOLN: 10.0000 mg | Freq: Four times a day (QID) | INTRAMUSCULAR | Status: DC

## 2016-11-09 MED ORDER — PROCHLORPERAZINE EDISYLATE 5 MG/ML IJ SOLN
10.0000 mg | Freq: Four times a day (QID) | INTRAMUSCULAR | Status: DC | PRN
  Administered 2016-11-09: 10 mg via INTRAVENOUS
  Filled 2016-11-09 (×2): qty 2

## 2016-11-09 NOTE — Progress Notes (Signed)
Increased output in JP drain today than previously

## 2016-11-09 NOTE — Progress Notes (Signed)
Patient ID: Melvin Warner, male   DOB: [birth year], 65 y.o.   MRN: [id number]  [facility] Progress Note  2 Days Post-Op  Subjective: Patient had some n/v yesterday after sipping on water. This has resolved today. States that he is not passing gas. His abdominal pain is controlled but is worse with moving.  Objective: Vital signs in last 24 hours: Temp:  [98.7 F (37.1 C)-99.3 F (37.4 C)] 98.8 F (37.1 C) (12/07 0429) Pulse Rate:  [82-101] 101 (12/07 0429) Resp:  [19-20] 20 (12/07 0429) BP: (113-145)/(61-73) 145/73 (12/07 0429) SpO2:  [92 %-96 %] 92 % (12/07 0429) Last BM Date:  (PTA)  Intake/Output from previous day: 12/06 0701 - 12/07 0700 In: 100 [IV Piggyback:100] Out: 1415 [Urine:1400; Drains:15] Intake/Output this shift: Total I/O In: 100 [IV Piggyback:100] Out: 220 [Urine:200; Drains:20]  PE: Gen:  Alert, NAD, pleasant Card:  RRR, no M/G/R heard Pulm:  CTAB, no W/R/R Abd: Soft, distended, appropriately tender, hypoactive BS, incisions C/D/I, drain with cloudy/yellow drainage  Pulling 1000 on IS  Lab Results:   Recent Labs  11/07/16 0844 11/08/16 0520  WBC 16.9* 15.2*  HGB 14.9 13.9  HCT 45.6 42.9  PLT 286 255   BMET  Recent Labs  11/07/16 0844 11/08/16 0520  NA 141 137  K 3.8 4.4  CL 103 102  CO2 31 27  GLUCOSE 155* 189*  BUN 16 20  CREATININE 1.01 1.07  CALCIUM 9.1 8.7*   PT/INR No results for input(s): LABPROT, INR in the last 72 hours. CMP     Component Value Date/Time   NA 137 11/08/2016 0520   K 4.4 11/08/2016 0520   CL 102 11/08/2016 0520   CO2 27 11/08/2016 0520   GLUCOSE 189 (H) 11/08/2016 0520   BUN 20 11/08/2016 0520   CREATININE 1.07 11/08/2016 0520   CALCIUM 8.7 (L) 11/08/2016 0520   PROT 7.6 11/07/2016 0844   ALBUMIN 4.6 11/07/2016 0844   AST 91 (H) 11/07/2016 0844   ALT 305 (H) 11/07/2016 0844   ALKPHOS 64 11/07/2016 0844   BILITOT 0.8 11/07/2016 0844   GFRNONAA >60 11/08/2016 0520   GFRAA >60  11/08/2016 0520   Lipase     Component Value Date/Time   LIPASE 14 11/07/2016 0844       Studies/Results: No results found.  Anti-infectives: Anti-infectives    Start     Dose/Rate Route Frequency Ordered Stop   11/07/16 2300  piperacillin-tazobactam (ZOSYN) IVPB 3.375 g     3.375 g 12.5 mL/hr over 240 Minutes Intravenous Every 8 hours 11/07/16 1924     11/07/16 1500  piperacillin-tazobactam (ZOSYN) IVPB 3.375 g  Status:  Discontinued     3.375 g 12.5 mL/hr over 240 Minutes Intravenous Every 8 hours 11/07/16 1455 11/07/16 1924   11/07/16 1415  piperacillin-tazobactam (ZOSYN) IVPB 3.375 g     3.375 g 12.5 mL/hr over 240 Minutes Intravenous  Once 11/07/16 1402 11/07/16 1835       Assessment/Plan Perforated appendicitis with fecal contamination and purulent peritonitis Laparoscopic appendectomy and primary closure of umbilical hernia [id number] Dr. Hassell Done - POD 2 - postop ileus. No n/v today.   Hypertension - hold lisinopril-HCTZ, hydralazine PRN Hyperlipidemia - hold atorvastatin Hypothyroidism - hold synthroid  ID - zosyn 12/5>> FEN - NPO/IVF VTE - heparin  Plan - continue NPO except sips/chips. Consult PT, and encourage mobilization to help return bowel function. Continue IS. Add scheduled toradol today (if helpful consider continuing for a few more days).  Continue IV antibiotics and drain. Labs pending.   LOS: 1 day    Jerrye Beavers , [facility] 11/09/2016, 10:52 AM Pager: [phone] Consults: [phone] Mon-Fri 7:00 am-4:30 pm Sat-Sun 7:00 am-11:30 am

## 2016-11-09 NOTE — Evaluation (Signed)
Physical Therapy Evaluation Patient Details Name: Melvin Warner MRN: [id number] DOB: [birth year] Today's Date: 11/09/2016   History of Present Illness  Pt is a 65 year old male s/p Laparoscopic appendectomy and primary closure of umbilical hernia [id number] due to Perforated appendicitis with fecal contamination and purulent peritonitis  Clinical Impression  Pt admitted with above diagnosis. Pt currently with functional limitations due to the deficits listed below (see PT Problem List).  Pt will benefit from skilled PT to increase their independence and safety with mobility to allow discharge to the venue listed below.  Pt very agreeable to mobilize and assisted with ambulating in hallway with RW.  Pt motivated to return home has he is eager to see his kitten and dog as soon as possible.  Pt reports he may have access to a RW however uncertain so recommend RW if he is unable to acquire.     Follow Up Recommendations Home health PT    Equipment Recommendations  Rolling walker with 5" wheels    Recommendations for Other Services       Precautions / Restrictions Precautions Precautions: Fall Precaution Comments: R sided JP drain      Mobility  Bed Mobility Overal bed mobility: Needs Assistance Bed Mobility: Rolling;Sidelying to Sit Rolling: Supervision Sidelying to sit: Supervision       General bed mobility comments: verbal cues for log roll technique to control abdominal pain with mobility, pt left sitting EOB with RN emptying JP drain  Transfers Overall transfer level: Needs assistance Equipment used: Rolling walker (2 wheeled) Transfers: Sit to/from Stand Sit to Stand: Min assist         General transfer comment: assist to rise and steady, verbal cues for hand placement  Ambulation/Gait Ambulation/Gait assistance: Min assist Ambulation Distance (Feet): 160 Feet Assistive device: Rolling walker (2 wheeled) Gait Pattern/deviations: Step-through pattern;Trunk  flexed;Decreased stride length     General Gait Details: verbal cues for safe use of RW, posture, pt initially min assist however progressed to min/guard  Stairs            Wheelchair Mobility    Modified Rankin (Stroke Patients Only)       Balance                                             Pertinent Vitals/Pain Pain Assessment: 0-10 Pain Score: 2  Pain Location: surgical site Pain Descriptors / Indicators: Sore Pain Intervention(s): Limited activity within patient's tolerance;Monitored during session    Home Living Family/patient expects to be discharged to:: Private residence Living Arrangements: Spouse/significant other   Type of Home: House Home Access: Level entry     Home Layout: One level   Additional Comments: may have access to a RW    Prior Function Level of Independence: Independent               Hand Dominance        Extremity/Trunk Assessment               Lower Extremity Assessment: Generalized weakness         Communication   Communication: No difficulties  Cognition Arousal/Alertness: Awake/alert Behavior During Therapy: WFL for tasks assessed/performed Overall Cognitive Status: Within Functional Limits for tasks assessed                      General  Comments      Exercises     Assessment/Plan    PT Assessment Patient needs continued PT services  PT Problem List Decreased strength;Decreased activity tolerance;Decreased knowledge of use of DME;Decreased mobility;Pain          PT Treatment Interventions DME instruction;Gait training;Functional mobility training;Therapeutic exercise;Therapeutic activities;Patient/family education    PT Goals (Current goals can be found in the Care Plan section)  Acute Rehab PT Goals PT Goal Formulation: With patient Time For Goal Achievement: 11/23/16 Potential to Achieve Goals: Good    Frequency Min 3X/week   Barriers to discharge         Co-evaluation               End of Session   Activity Tolerance: Patient tolerated treatment well Patient left: in bed;with call bell/phone within reach;with nursing/sitter in room Nurse Communication: Mobility status         Time: [id number] PT Time Calculation (min) (ACUTE ONLY): 14 min   Charges:   PT Evaluation $PT Eval Low Complexity: 1 Procedure     PT G Codes:        Izabella Marcantel,[name] 11/09/2016, 4:01 PM [name], PT, DPT 11/09/2016 Pager: [phone]

## 2016-11-10 LAB — BASIC METABOLIC PANEL
Anion gap: 11 (ref 5–15)
BUN: 32 mg/dL — AB (ref 6–20)
CALCIUM: 8.5 mg/dL — AB (ref 8.9–10.3)
CO2: 22 mmol/L (ref 22–32)
CREATININE: 1.26 mg/dL — AB (ref 0.61–1.24)
Chloride: 102 mmol/L (ref 101–111)
GFR calc non Af Amer: 59 mL/min — ABNORMAL LOW (ref >60)
Glucose, Bld: 191 mg/dL — ABNORMAL HIGH (ref 65–99)
Potassium: 4.5 mmol/L (ref 3.5–5.1)
Sodium: 135 mmol/L (ref 135–145)

## 2016-11-10 LAB — CBC
HEMATOCRIT: 47.3 % (ref 39.0–52.0)
Hemoglobin: 15.6 g/dL (ref 13.0–17.0)
MCH: 29.2 pg (ref 26.0–34.0)
MCHC: 33 g/dL (ref 30.0–36.0)
MCV: 88.4 fL (ref 78.0–100.0)
Platelets: 346 10*3/uL (ref 150–400)
RBC: 5.35 MIL/uL (ref 4.22–5.81)
RDW: 14.9 % (ref 11.5–15.5)
WBC: 15 10*3/uL — ABNORMAL HIGH (ref 4.0–10.5)

## 2016-11-10 LAB — HEMOGLOBIN A1C
HEMOGLOBIN A1C: 6.8 % — AB (ref 4.8–5.6)
MEAN PLASMA GLUCOSE: 148 mg/dL

## 2016-11-10 MED ORDER — KCL IN DEXTROSE-NACL 20-5-0.9 MEQ/L-%-% IV SOLN
INTRAVENOUS | Status: DC
  Administered 2016-11-10 – 2016-11-13 (×6): via INTRAVENOUS
  Filled 2016-11-10 (×9): qty 1000

## 2016-11-10 NOTE — Progress Notes (Signed)
3 Days Post-Op  Subjective: He looks good and denies any nausea.  Sore but tolerating it well.  Drain is full and it is cloudy clear serosanguinous like fluid.    Objective: Vital signs in last 24 hours: Temp:  [98.4 F (36.9 C)-98.9 F (37.2 C)] 98.9 F (37.2 C) (12/08 0434) Pulse Rate:  [92-111] 92 (12/08 0434) Resp:  [18-20] 18 (12/08 0434) BP: (129-152)/(69-80) 152/77 (12/08 0434) SpO2:  [92 %-93 %] 92 % (12/08 0434) Last BM Date:  (PTA) 70 PO 3951 IV fluids Urine 700 Drain 260 Afebrile VSS Creatinine is up ,WBC still 15K   Intake/Output from previous day: 12/07 0701 - 12/08 0700 In: 4171.7 [P.O.:70; I.V.:3951.7; IV Piggyback:150] Out: 960 [Urine:700; Drains:260] Intake/Output this shift: Total I/O In: 120 [P.O.:120] Out: 225 [Urine:225]  General appearance: alert, cooperative and no distress Resp: clear to auscultation bilaterally GI: distended some, no BS, fluid from drain as above, cloudy serosanguinous fluid.  much less tender than I expected.  Lab Results:   Recent Labs  11/09/16 1121 11/10/16 0541  WBC 16.1* 15.0*  HGB 16.2 15.6  HCT 48.2 47.3  PLT 316 346    BMET  Recent Labs  11/09/16 1121 11/10/16 0541  NA 134* 135  K 4.4 4.5  CL 100* 102  CO2 22 22  GLUCOSE 143* 191*  BUN 23* 32*  CREATININE 1.09 1.26*  CALCIUM 9.0 8.5*   PT/INR No results for input(s): LABPROT, INR in the last 72 hours.   Recent Labs Lab 11/07/16 0844  AST 91*  ALT 305*  ALKPHOS 64  BILITOT 0.8  PROT 7.6  ALBUMIN 4.6     Lipase     Component Value Date/Time   LIPASE 14 11/07/2016 0844     Studies/Results: No results found. Prior to Admission medications   Medication Sig Start Date End Date Taking? Authorizing Provider  atorvastatin (LIPITOR) 20 MG tablet Take 20 mg by mouth daily.   Yes Historical Provider, MD  diclofenac (VOLTAREN) 75 MG EC tablet Take 75 mg by mouth 2 (two) times daily as needed for moderate pain.   Yes Historical Provider, MD   levothyroxine (SYNTHROID, LEVOTHROID) 100 MCG tablet Take 100 mcg by mouth daily before breakfast.   Yes Historical Provider, MD  lisinopril-hydrochlorothiazide (PRINZIDE,ZESTORETIC) 20-12.5 MG tablet Take 1 tablet by mouth daily.   Yes Historical Provider, MD  Multiple Vitamins-Minerals (MENS ONE DAILY PO) Take 1 tablet by mouth daily.   Yes Historical Provider, MD  nystatin cream (MYCOSTATIN) Apply 1 application topically 2 (two) times daily as needed for dry skin.   Yes Historical Provider, MD  oxymetazoline (AFRIN) 0.05 % nasal spray Place 1 spray into both nostrils 2 (two) times daily as needed for congestion.   Yes Historical Provider, MD    Medications: . heparin subcutaneous  5,000 Units Subcutaneous Q8H  . pantoprazole (PROTONIX) IV  40 mg Intravenous QHS  . piperacillin-tazobactam (ZOSYN)  IV  3.375 g Intravenous Q8H   . dextrose 5 % and 0.45 % NaCl with KCl 20 mEq/L 100 mL/hr at 11/09/16 1350   Assessment/Plan  Perforated appendicitis with fecal contamination and purulent peritonitis  s/p  Laparoscopic appendectomy and primary closure of umbilical hernia, [id number] Dr. Johnathan Hausen POD 3 Elevated creatinine  Up ot 1.26  - unsure of I/O Hypertension - hold lisinopril-HCTZ, hydralazine PRN Hyperlipidemia - hold atorvastatin Hypothyroidism - hold synthroid ID - zosyn 12/5>> day 4 FEN - NPO/IVF VTE - heparin   Plan:  Sips and ice  chips, continue antibiotics, IV fluid hydration  LOS: 2 days    Melvin Warner 11/10/2016 [id number]

## 2016-11-10 NOTE — Progress Notes (Signed)
Physical Therapy Treatment Patient Details Name: Melvin Warner MRN: [id number] DOB: 12-20-1950 Today's Date: 19-Nov-2016    History of Present Illness Pt is a 65 year old male s/p Laparoscopic appendectomy and primary closure of umbilical hernia [id number] due to Perforated appendicitis with fecal contamination and purulent peritonitis    PT Comments    Pt able to improve ambulation distance today and agreeable to ambulate with nursing staff again later today.  Follow Up Recommendations  Home health PT     Equipment Recommendations  Rolling walker with 5" wheels    Recommendations for Other Services       Precautions / Restrictions Precautions Precautions: Fall Precaution Comments: R sided JP drain    Mobility  Bed Mobility Overal bed mobility: Needs Assistance Bed Mobility: Supine to Sit     Supine to sit: Supervision        Transfers Overall transfer level: Needs assistance Equipment used: Rolling walker (2 wheeled) Transfers: Sit to/from Stand Sit to Stand: Min guard         General transfer comment: verbal cues for hand placement  Ambulation/Gait Ambulation/Gait assistance: Min guard Ambulation Distance (Feet): 240 Feet Assistive device: Rolling walker (2 wheeled) Gait Pattern/deviations: Step-through pattern;Trunk flexed;Decreased stride length     General Gait Details: verbal cues for safe use of RW, posture   Stairs            Wheelchair Mobility    Modified Rankin (Stroke Patients Only)       Balance                                    Cognition Arousal/Alertness: Awake/alert Behavior During Therapy: WFL for tasks assessed/performed Overall Cognitive Status: Within Functional Limits for tasks assessed                      Exercises      General Comments        Pertinent Vitals/Pain Pain Assessment: No/denies pain    Home Living                      Prior Function            PT Goals  (current goals can now be found in the care plan section) Progress towards PT goals: Progressing toward goals    Frequency    Min 3X/week      PT Plan Current plan remains appropriate    Co-evaluation             End of Session   Activity Tolerance: Patient tolerated treatment well Patient left: with call bell/phone within reach;with chair alarm set     Time: [id number] PT Time Calculation (min) (ACUTE ONLY): 13 min  Charges:  $Gait Training: 8-22 mins                    G Codes:      Ophia Shamoon,[name] 2016/11/19, 1:17 PM [name], PT, DPT November 19, 2016 Pager: [phone]

## 2016-11-11 LAB — BASIC METABOLIC PANEL
ANION GAP: 9 (ref 5–15)
BUN: 27 mg/dL — ABNORMAL HIGH (ref 6–20)
CALCIUM: 8.5 mg/dL — AB (ref 8.9–10.3)
CO2: 23 mmol/L (ref 22–32)
Chloride: 103 mmol/L (ref 101–111)
Creatinine, Ser: 0.98 mg/dL (ref 0.61–1.24)
Glucose, Bld: 199 mg/dL — ABNORMAL HIGH (ref 65–99)
Potassium: 4.4 mmol/L (ref 3.5–5.1)
SODIUM: 135 mmol/L (ref 135–145)

## 2016-11-11 LAB — CBC
HCT: 47.5 % (ref 39.0–52.0)
Hemoglobin: 15.6 g/dL (ref 13.0–17.0)
MCH: 29.2 pg (ref 26.0–34.0)
MCHC: 32.8 g/dL (ref 30.0–36.0)
MCV: 89 fL (ref 78.0–100.0)
Platelets: 372 K/uL (ref 150–400)
RBC: 5.34 MIL/uL (ref 4.22–5.81)
RDW: 14.7 % (ref 11.5–15.5)
WBC: 14.7 K/uL — ABNORMAL HIGH (ref 4.0–10.5)

## 2016-11-11 LAB — GLUCOSE, CAPILLARY: GLUCOSE-CAPILLARY: 163 mg/dL — AB (ref 65–99)

## 2016-11-11 MED ORDER — SODIUM CHLORIDE 0.9 % IV SOLN
25.0000 mg | Freq: Four times a day (QID) | INTRAVENOUS | Status: DC | PRN
  Administered 2016-11-11 (×2): 25 mg via INTRAVENOUS
  Filled 2016-11-11 (×4): qty 1

## 2016-11-11 NOTE — Progress Notes (Signed)
4 Days Post-Op  Subjective: Several BMs, having hiccups  Objective: Vital signs in last 24 hours: Temp:  [98.4 F (36.9 C)-99 F (37.2 C)] 98.8 F (37.1 C) (12/09 0505) Pulse Rate:  [86-92] 92 (12/09 0505) Resp:  [18] 18 (12/09 0505) BP: (124-154)/(67-87) 154/87 (12/09 0505) SpO2:  [93 %-95 %] 93 % (12/09 0505) Last BM Date: 11/11/16  Intake/Output from previous day: 12/08 0701 - 12/09 0700 In: 1648.3 [P.O.:120; I.V.:1478.3; IV Piggyback:50] Out: 1941 [Urine:1800; Drains:140; Stool:1] Intake/Output this shift: No intake/output data recorded.  General appearance: cooperative Resp: clear to auscultation bilaterally GI: soft, +BS, incisions CDI, JP cloudy yellow  Lab Results:   Recent Labs  11/10/16 0541 11/11/16 0543  WBC 15.0* 14.7*  HGB 15.6 15.6  HCT 47.3 47.5  PLT 346 372   BMET  Recent Labs  11/10/16 0541 11/11/16 0543  NA 135 135  K 4.5 4.4  CL 102 103  CO2 22 23  GLUCOSE 191* 199*  BUN 32* 27*  CREATININE 1.26* 0.98  CALCIUM 8.5* 8.5*   PT/INR No results for input(s): LABPROT, INR in the last 72 hours. ABG No results for input(s): PHART, HCO3 in the last 72 hours.  Invalid input(s): PCO2, PO2  Studies/Results: No results found.  Anti-infectives: Anti-infectives    Start     Dose/Rate Route Frequency Ordered Stop   11/07/16 2300  piperacillin-tazobactam (ZOSYN) IVPB 3.375 g     3.375 g 12.5 mL/hr over 240 Minutes Intravenous Every 8 hours 11/07/16 1924     11/07/16 1500  piperacillin-tazobactam (ZOSYN) IVPB 3.375 g  Status:  Discontinued     3.375 g 12.5 mL/hr over 240 Minutes Intravenous Every 8 hours 11/07/16 1455 11/07/16 1924   11/07/16 1415  piperacillin-tazobactam (ZOSYN) IVPB 3.375 g     3.375 g 12.5 mL/hr over 240 Minutes Intravenous  Once 11/07/16 1402 11/07/16 1835      Assessment/Plan: Perforated appendicitis with fecal contamination and purulent peritonitis  s/p  Laparoscopic appendectomy and primary closure of umbilical  hernia, [id number] Dr. Johnathan Hausen POD 3 Elevated creatinine  Up ot 1.26  - now normal Hypertension - hold lisinopril-HCTZ, hydralazine PRN Hyperlipidemia - hold atorvastatin Hypothyroidism - hold synthroid ID - zosyn 12/5>> day 4 FEN - NPO/IVF VTE - heparin   Plan: clears, thorazine for hiccups   LOS: 3 days    Melvin Warner E 11/11/2016

## 2016-11-12 LAB — CBC
HEMATOCRIT: 43.6 % (ref 39.0–52.0)
HEMOGLOBIN: 14.2 g/dL (ref 13.0–17.0)
MCH: 29 pg (ref 26.0–34.0)
MCHC: 32.6 g/dL (ref 30.0–36.0)
MCV: 89 fL (ref 78.0–100.0)
Platelets: 359 10*3/uL (ref 150–400)
RBC: 4.9 MIL/uL (ref 4.22–5.81)
RDW: 14.5 % (ref 11.5–15.5)
WBC: 6 10*3/uL (ref 4.0–10.5)

## 2016-11-12 MED ORDER — LEVOTHYROXINE SODIUM 100 MCG PO TABS
100.0000 ug | ORAL_TABLET | Freq: Every day | ORAL | Status: DC
  Administered 2016-11-12 – 2016-11-14 (×3): 100 ug via ORAL
  Filled 2016-11-12 (×3): qty 1

## 2016-11-12 NOTE — Progress Notes (Signed)
Assessment Perforated appendicitis with fecal contamination and purulent peritonitis  s/p Laparoscopic appendectomy and primary closure of umbilical hernia, [id number] Dr. Serita Grammes with some ileus.   Hypertension - hold lisinopril-HCTZ, hydralazine PRN  Hypothyroidism - hold synthroid ID - zosyn 12/5>> day 5 FEN - clear liquds VTE - heparin  Plan:  Keep on clear liquids until ileus improves more.  Increase ambulation.   LOS: 4 days     5 Days Post-Op  Subjective: Had some n/v after clear liquids yesterday.  Bowels moving. Wife in room.  Objective: Vital signs in last 24 hours: Temp:  [99.2 F (37.3 C)-99.5 F (37.5 C)] 99.2 F (37.3 C) (12/10 0451) Pulse Rate:  [93-106] 101 (12/10 0451) Resp:  [18] 18 (12/10 0451) BP: (119-149)/(78-86) 135/86 (12/10 0451) SpO2:  [91 %-93 %] 93 % (12/10 0451) Last BM Date: 11/12/16  Intake/Output from previous day: 12/09 0701 - 12/10 0700 In: 1253.3 [I.V.:1128.3; IV Piggyback:125] Out: 736 [Urine:675; Emesis/NG output:1; Drains:60] Intake/Output this shift: Total I/O In: -  Out: 330 [Urine:300; Drains:30]  PE: General- In NAD Abdomen-soft, some distension, hypoactive bowel sounds, serous drain output, wounds clean and intact  Lab Results:   Recent Labs  11/11/16 0543 11/12/16 0552  WBC 14.7* 6.0  HGB 15.6 14.2  HCT 47.5 43.6  PLT 372 359   BMET  Recent Labs  11/10/16 0541 11/11/16 0543  NA 135 135  K 4.5 4.4  CL 102 103  CO2 22 23  GLUCOSE 191* 199*  BUN 32* 27*  CREATININE 1.26* 0.98  CALCIUM 8.5* 8.5*   PT/INR No results for input(s): LABPROT, INR in the last 72 hours. Comprehensive Metabolic Panel:    Component Value Date/Time   NA 135 11/11/2016 0543   NA 135 11/10/2016 0541   K 4.4 11/11/2016 0543   K 4.5 11/10/2016 0541   CL 103 11/11/2016 0543   CL 102 11/10/2016 0541   CO2 23 11/11/2016 0543   CO2 22 11/10/2016 0541   BUN 27 (H) 11/11/2016 0543   BUN 32 (H) 11/10/2016 0541   CREATININE 0.98 11/11/2016 0543   CREATININE 1.26 (H) 11/10/2016 0541   GLUCOSE 199 (H) 11/11/2016 0543   GLUCOSE 191 (H) 11/10/2016 0541   CALCIUM 8.5 (L) 11/11/2016 0543   CALCIUM 8.5 (L) 11/10/2016 0541   AST 91 (H) 11/07/2016 0844   ALT 305 (H) 11/07/2016 0844   ALKPHOS 64 11/07/2016 0844   BILITOT 0.8 11/07/2016 0844   PROT 7.6 11/07/2016 0844   ALBUMIN 4.6 11/07/2016 0844     Studies/Results: No results found.  Anti-infectives: Anti-infectives    Start     Dose/Rate Route Frequency Ordered Stop   11/07/16 2300  piperacillin-tazobactam (ZOSYN) IVPB 3.375 g     3.375 g 12.5 mL/hr over 240 Minutes Intravenous Every 8 hours 11/07/16 1924     11/07/16 1500  piperacillin-tazobactam (ZOSYN) IVPB 3.375 g  Status:  Discontinued     3.375 g 12.5 mL/hr over 240 Minutes Intravenous Every 8 hours 11/07/16 1455 11/07/16 1924   11/07/16 1415  piperacillin-tazobactam (ZOSYN) IVPB 3.375 g     3.375 g 12.5 mL/hr over 240 Minutes Intravenous  Once 11/07/16 1402 11/07/16 [place] J 11/12/2016

## 2016-11-13 MED ORDER — HYDROCODONE-ACETAMINOPHEN 5-325 MG PO TABS: 1.0000 | ORAL_TABLET | Freq: Four times a day (QID) | ORAL | Status: DC | PRN

## 2016-11-13 MED ORDER — PANTOPRAZOLE SODIUM 40 MG PO TBEC
40.0000 mg | DELAYED_RELEASE_TABLET | Freq: Every day | ORAL | Status: DC
  Administered 2016-11-13: 40 mg via ORAL
  Filled 2016-11-13: qty 1

## 2016-11-13 NOTE — Progress Notes (Signed)
Patient ID: Melvin Warner, male   DOB: 1951-08-02, 65 y.o.   MRN: [id number]  [facility] Surgery Progress Note  6 Days Post-Op  Subjective: No complaints today. Tolerating clear liquids. Reports multiple BM's yesterday. Hiccups resolved.  Objective: Vital signs in last 24 hours: Temp:  [97.8 F (36.6 C)-98.9 F (37.2 C)] 98.4 F (36.9 C) (12/11 0450) Pulse Rate:  [78-102] 82 (12/11 0450) Resp:  [18] 18 (12/10 2047) BP: (139-146)/(77-81) 140/77 (12/11 0450) SpO2:  [95 %-99 %] 95 % (12/11 0450) Last BM Date: 11/12/16  Intake/Output from previous day: 12/10 0701 - 12/11 0700 In: 1000 [I.V.:950; IV Piggyback:50] Out: 360 [Urine:300; Drains:60] Intake/Output this shift: Total I/O In: 360 [P.O.:360] Out: 75 [Drains:75]  PE: Gen:  Alert, NAD, pleasant Card:  RRR, no M/G/R heard Pulm:  CTAB, no W/R/R Abd: Soft, mild distension, mild global tenderness, +BS, incisions C/D/I, drain with slightly cloudy/serous drainage  Lab Results:   Recent Labs  11/11/16 0543 11/12/16 0552  WBC 14.7* 6.0  HGB 15.6 14.2  HCT 47.5 43.6  PLT 372 359   BMET  Recent Labs  11/11/16 0543  NA 135  K 4.4  CL 103  CO2 23  GLUCOSE 199*  BUN 27*  CREATININE 0.98  CALCIUM 8.5*   PT/INR No results for input(s): LABPROT, INR in the last 72 hours. CMP     Component Value Date/Time   NA 135 11/11/2016 0543   K 4.4 11/11/2016 0543   CL 103 11/11/2016 0543   CO2 23 11/11/2016 0543   GLUCOSE 199 (H) 11/11/2016 0543   BUN 27 (H) 11/11/2016 0543   CREATININE 0.98 11/11/2016 0543   CALCIUM 8.5 (L) 11/11/2016 0543   PROT 7.6 11/07/2016 0844   ALBUMIN 4.6 11/07/2016 0844   AST 91 (H) 11/07/2016 0844   ALT 305 (H) 11/07/2016 0844   ALKPHOS 64 11/07/2016 0844   BILITOT 0.8 11/07/2016 0844   GFRNONAA >60 11/11/2016 0543   GFRAA >60 11/11/2016 0543   Lipase     Component Value Date/Time   LIPASE 14 11/07/2016 0844       Studies/Results: No results  found.  Anti-infectives: Anti-infectives    Start     Dose/Rate Route Frequency Ordered Stop   11/07/16 2300  piperacillin-tazobactam (ZOSYN) IVPB 3.375 g     3.375 g 12.5 mL/hr over 240 Minutes Intravenous Every 8 hours 11/07/16 1924     11/07/16 1500  piperacillin-tazobactam (ZOSYN) IVPB 3.375 g  Status:  Discontinued     3.375 g 12.5 mL/hr over 240 Minutes Intravenous Every 8 hours 11/07/16 1455 11/07/16 1924   11/07/16 1415  piperacillin-tazobactam (ZOSYN) IVPB 3.375 g     3.375 g 12.5 mL/hr over 240 Minutes Intravenous  Once 11/07/16 1402 11/07/16 1835       Assessment/Plan Perforated appendicitis with fecal contamination and purulent peritonitis Laparoscopic appendectomy and primary closure of umbilical hernia [id number] Dr. Hassell Done - POD 6 - WBC WNL, afebrile - drain 60cc/24hr slightly cloudy/serous fluid  Hypertension - hold lisinopril-HCTZ, hydralazine PRN Hyperlipidemia - hold atorvastatin Hypothyroidism - synthroid  ID - zosyn 12/5>> day#6 FEN - full liquid diet, IVF VTE - heparin  Plan - bowel function returning. decrease IVF and advance to full liquid diet. Continue drain and antibiotics. Continue mobilization/IS   LOS: 5 days    Jerrye Beavers , [facility] Surgery 11/13/2016, 10:27 AM Pager: [phone] Consults: [phone] Mon-Fri 7:00 am-4:30 pm Sat-Sun 7:00 am-11:30 am

## 2016-11-13 NOTE — Progress Notes (Signed)
Physical Therapy Treatment Patient Details Name: Melvin Warner MRN: [id number] DOB: Apr 06, 1951 Today's Date: 11/22/2016    History of Present Illness Pt is a 65 year old male s/p Laparoscopic appendectomy and primary closure of umbilical hernia 98/2/64 due to Perforated appendicitis with fecal contamination and purulent peritonitis    PT Comments    Pt ambulated well in hallway and able to ambulate without RW today.  Pt has met acute care goals and agreeable to continue ambulating during acute stay.  Acute PT to sign off.  Follow Up Recommendations  Home health PT     Equipment Recommendations  None recommended by PT    Recommendations for Other Services       Precautions / Restrictions Precautions Precautions: Fall Precaution Comments: R sided JP drain    Mobility  Bed Mobility Overal bed mobility: Modified Independent                Transfers Overall transfer level: Modified independent                  Ambulation/Gait Ambulation/Gait assistance: Supervision;Modified independent (Device/Increase time) Ambulation Distance (Feet): 400 Feet Assistive device: Rolling walker (2 wheeled) Gait Pattern/deviations: Step-through pattern;Trunk flexed     General Gait Details: initially used RW for 200 feet however pt able to ambulate back to room 200 feet without RW or UE support, steady with no LOB, only cues for upright trunk posture   Stairs            Wheelchair Mobility    Modified Rankin (Stroke Patients Only)       Balance                                    Cognition Arousal/Alertness: Awake/alert Behavior During Therapy: WFL for tasks assessed/performed Overall Cognitive Status: Within Functional Limits for tasks assessed                      Exercises      General Comments        Pertinent Vitals/Pain Pain Assessment: No/denies pain    Home Living                      Prior Function             PT Goals (current goals can now be found in the care plan section) Progress towards PT goals: Goals met/education completed, patient discharged from PT    Frequency           PT Plan Other (comment) (d/c from acute PT)    Co-evaluation             End of Session   Activity Tolerance: Patient tolerated treatment well Patient left: in bed;with call bell/phone within reach;with family/visitor present     Time: 1583-0940 PT Time Calculation (min) (ACUTE ONLY): 11 min  Charges:  $Gait Training: 8-22 mins                    G Codes:      Melvin Warner,Melvin Warner 11-22-2016, 1:53 PM Melvin Warner, PT, DPT 22-Nov-2016 Pager: [phone]

## 2016-11-14 MED ORDER — HYDROCODONE-ACETAMINOPHEN 5-325 MG PO TABS
1.0000 | ORAL_TABLET | Freq: Four times a day (QID) | ORAL | 0 refills | Status: DC | PRN
Start: 2016-11-14 — End: 2018-05-09

## 2016-11-14 MED ORDER — AMOXICILLIN-POT CLAVULANATE 875-125 MG PO TABS: 1.0000 | ORAL_TABLET | Freq: Two times a day (BID) | ORAL | 0 refills | Status: DC

## 2016-11-14 NOTE — Discharge Instructions (Signed)

## 2016-11-14 NOTE — Progress Notes (Signed)
Patient given prescriptions and discharge instructions. No questions at this time.

## 2016-11-14 NOTE — Discharge Summary (Signed)
[facility] Discharge Summary   Patient ID: Melvin Warner MRN: [id number] DOB/AGE: 1951/05/29 65 y.o.  Admit date: 11/07/2016 Discharge date: 11/14/2016  Admitting Diagnosis: Acute appendicitis with peritonitis  Discharge Diagnosis Patient Active Problem List   Diagnosis Date Noted  . Ruptured suppurative appendicitis Dec 2017 11/07/2016    Consultants None  Imaging: CT abdomen pelvis w contrast 11/07/16: 1. Positive for Acute Appendicitis. Dilated and inflamed appendix with multiple 8-9 mm appendicolith thus. No abscess. No evidence of rupture at this time. 2.  Calcified aortic atherosclerosis.  Procedures Dr. Hassell Done (11/07/16) - Laparoscopic appendectomy and primary closure of umbilical hernia  Hospital Course:  Melvin Warner is a 64yo male who presented to [facility] 11/07/16 with acute onset lower abdominal pain that localized to the RLQ.  Workup showed acute appendicitis with appendicolith and leukocytosis.  Patient was admitted and underwent procedure listed above.  Tolerated procedure well and was transferred to the floor.  He was kept on zosyn postoperatively. He did have an ileus postoperatively as expected, but on POD6 his bowel function began to return. He was started on a clear liquid diet and advanced as tolerated. Patient worked with physical therapy during this admission. On POD7 the patient was voiding well, tolerating diet, ambulating well, pain well controlled, vital signs stable, incisions c/d/i and felt stable for discharge home.  His drain placed intraoperatively was only putting out serous fluid therefore it was removed. Patient will take augmentin for 7 days. He will follow up in our office in 1-2 weeks and knows to call with questions or concerns.  He will call to confirm appointment date/time.    Physical Exam: Gen: Alert, NAD, pleasant Card: RRR, no M/G/R heard Pulm: CTAB, no W/R/R Abd: Soft, mild distension, mild global tenderness, +BS,  incisions C/D/I, drain with serousdrainage     Medication List    TAKE these medications   amoxicillin-clavulanate 875-125 MG tablet Commonly known as:  AUGMENTIN Take 1 tablet by mouth 2 (two) times daily.   atorvastatin 20 MG tablet Commonly known as:  LIPITOR Take 20 mg by mouth daily.   diclofenac 75 MG EC tablet Commonly known as:  VOLTAREN Take 75 mg by mouth 2 (two) times daily as needed for moderate pain.   HYDROcodone-acetaminophen 5-325 MG tablet Commonly known as:  NORCO/VICODIN Take 1 tablet by mouth every 6 (six) hours as needed for moderate pain or severe pain.   levothyroxine 100 MCG tablet Commonly known as:  SYNTHROID, LEVOTHROID Take 100 mcg by mouth daily before breakfast.   lisinopril-hydrochlorothiazide 20-12.5 MG tablet Commonly known as:  PRINZIDE,ZESTORETIC Take 1 tablet by mouth daily.   MENS ONE DAILY PO Take 1 tablet by mouth daily.   nystatin cream Commonly known as:  MYCOSTATIN Apply 1 application topically 2 (two) times daily as needed for dry skin.   oxymetazoline 0.05 % nasal spray Commonly known as:  AFRIN Place 1 spray into both nostrils 2 (two) times daily as needed for congestion.        Follow-up [facility], [place]. Go on 12/05/2016.   Specialty:  General Surgery Why:  Your appointment is 12/05/16 @ 9:45am. Please arrive 30 minutes prior to appointment time to check in and fill out necessary paperwork Contact information: [address] [place] [phone]          Signed: Jerrye Beavers, [facility] Surgery 11/14/2016, 2:42 PM Pager: [phone] Consults: [phone] Mon-Fri 7:00  am-4:30 pm Sat-Sun 7:00 am-11:30 am

## 2016-11-14 NOTE — Progress Notes (Signed)
Patient ID: Melvin Warner, male   DOB: 04/14/1951, 65 y.o.   MRN: [id number]  [facility] Surgery Progress Note  7 Days Post-Op  Subjective: Feeling well and not taking any pain medication. Wants to go home. Tolerating full liquids. Having regular bowel movements. No fevers.  Objective: Vital signs in last 24 hours: Temp:  [98.2 F (36.8 C)-98.6 F (37 C)] 98.2 F (36.8 C) (12/12 0436) Pulse Rate:  [76-80] 77 (12/12 0436) Resp:  [18] 18 (12/12 0436) BP: (126-148)/(65-74) 126/68 (12/12 0436) SpO2:  [96 %-98 %] 96 % (12/12 0436) Last BM Date: 11/13/16  Intake/Output from previous day: 12/11 0701 - 12/12 0700 In: 1053.3 [P.O.:360; I.V.:643.3; IV Piggyback:50] Out: 90 [Drains:90] Intake/Output this shift: Total I/O In: 360 [P.O.:360] Out: -   PE: Gen: Alert, NAD, pleasant Card: RRR, no M/G/R heard Pulm: CTAB, no W/R/R Abd: Soft, mild distension, mild global tenderness, +BS, incisions C/D/I, drain with serous drainage  Lab Results:   Recent Labs  11/12/16 0552  WBC 6.0  HGB 14.2  HCT 43.6  PLT 359   BMET No results for input(s): NA, K, CL, CO2, GLUCOSE, BUN, CREATININE, CALCIUM in the last 72 hours. PT/INR No results for input(s): LABPROT, INR in the last 72 hours. CMP     Component Value Date/Time   NA 135 11/11/2016 0543   K 4.4 11/11/2016 0543   CL 103 11/11/2016 0543   CO2 23 11/11/2016 0543   GLUCOSE 199 (H) 11/11/2016 0543   BUN 27 (H) 11/11/2016 0543   CREATININE 0.98 11/11/2016 0543   CALCIUM 8.5 (L) 11/11/2016 0543   PROT 7.6 11/07/2016 0844   ALBUMIN 4.6 11/07/2016 0844   AST 91 (H) 11/07/2016 0844   ALT 305 (H) 11/07/2016 0844   ALKPHOS 64 11/07/2016 0844   BILITOT 0.8 11/07/2016 0844   GFRNONAA >60 11/11/2016 0543   GFRAA >60 11/11/2016 0543   Lipase     Component Value Date/Time   LIPASE 14 11/07/2016 0844       Studies/Results: No results found.  Anti-infectives: Anti-infectives    Start     Dose/Rate Route Frequency  Ordered Stop   11/07/16 2300  piperacillin-tazobactam (ZOSYN) IVPB 3.375 g     3.375 g 12.5 mL/hr over 240 Minutes Intravenous Every 8 hours 11/07/16 1924     11/07/16 1500  piperacillin-tazobactam (ZOSYN) IVPB 3.375 g  Status:  Discontinued     3.375 g 12.5 mL/hr over 240 Minutes Intravenous Every 8 hours 11/07/16 1455 11/07/16 1924   11/07/16 1415  piperacillin-tazobactam (ZOSYN) IVPB 3.375 g     3.375 g 12.5 mL/hr over 240 Minutes Intravenous  Once 11/07/16 1402 11/07/16 1835       Assessment/Plan Perforated appendicitis with fecal contamination and purulent peritonitis Laparoscopic appendectomy and primary closure of umbilical [id number] Dr. Hassell Done - POD 7 - WBC WNL, afebrile - drain 90cc/24hr serous fluid  Hypertension - hold lisinopril-HCTZ, hydralazine PRN Hyperlipidemia - hold atorvastatin Hypothyroidism - synthroid  ID - zosyn 12/5>> day#7 FEN - soft diet VTE - heparin  Plan - bowel function returned and patient tolerating diet. Not taking any pain medication. advance to soft diet. D/c drain. Ready for discharge. Will discharge on augmentin x7 days with follow-up in 1-2 weeks.    LOS: 6 days    Jerrye Beavers , [facility] Surgery 11/14/2016, 11:04 AM Pager: [phone] Consults: [phone] Mon-Fri 7:00 am-4:30 pm Sat-Sun 7:00 am-11:30 am

## 2018-05-09 ENCOUNTER — Ambulatory Visit: Payer: Managed Care, Other (non HMO) | Admitting: Podiatry

## 2018-05-09 ENCOUNTER — Encounter: Payer: Self-pay | Admitting: Podiatry

## 2018-05-09 VITALS — BP 135/79 | HR 73 | Resp 16

## 2018-05-09 DIAGNOSIS — I10 Essential (primary) hypertension: Secondary | ICD-10-CM | POA: Insufficient documentation

## 2018-05-09 DIAGNOSIS — Q828 Other specified congenital malformations of skin: Secondary | ICD-10-CM | POA: Diagnosis not present

## 2018-05-09 DIAGNOSIS — E785 Hyperlipidemia, unspecified: Secondary | ICD-10-CM | POA: Insufficient documentation

## 2018-05-09 DIAGNOSIS — E039 Hypothyroidism, unspecified: Secondary | ICD-10-CM | POA: Insufficient documentation

## 2018-05-09 NOTE — Progress Notes (Signed)
  Subjective:  Patient ID: Melvin Warner, male    DOB: 12/23/1950,  MRN: [id number] HPI Chief Complaint  Patient presents with  . Foot Pain    Plantar heel left - callused area x several months, soaking in epsom salts  . New Patient (Initial Visit)    67 y.o. male presents with the above complaint.   ROS: Denies fever chills nausea vomiting muscle aches pains calf pain back pain chest pain shortness of breath.   Past Medical History:  Diagnosis Date  . High cholesterol   . Hypertension   . Leg cramps   . Thyroid disease    Past Surgical History:  Procedure Laterality Date  . CYST REMOVAL NECK    . LAPAROSCOPIC APPENDECTOMY N/A 11/07/2016   Procedure: APPENDECTOMY LAPAROSCOPIC;  Surgeon: Johnathan Hausen, MD;  Location: [facility] ORS;  Service: General;  Laterality: N/A;    Current Outpatient Medications:  .  atorvastatin (LIPITOR) 20 MG tablet, Take 20 mg by mouth daily., Disp: , Rfl:  .  Influenza vac split quadrivalent PF (FLUZONE QUADRIVALENT) 0.5 ML injection, Fluzone Quad 2018-19(PF) 60 mcg(15 mcgx4)/0.5 mL intramuscular syringe  TO BE ADMINISTERED BY PHARMACIST FOR IMMUNIZATION, Disp: , Rfl:  .  levothyroxine (SYNTHROID, LEVOTHROID) 125 MCG tablet, Take 125 mcg by mouth daily., Disp: , Rfl: 3 .  lisinopril-hydrochlorothiazide (PRINZIDE,ZESTORETIC) 20-12.5 MG tablet, Take 1 tablet by mouth daily., Disp: , Rfl:  .  metFORMIN (GLUCOPHAGE) 500 MG tablet, metformin 500 mg tablet  TAKE 1 TABLET BY MOUTH TWICE A DAY, Disp: , Rfl:  .  Multiple Vitamins-Minerals (MENS ONE DAILY PO), Take 1 tablet by mouth daily., Disp: , Rfl:  .  omeprazole (PRILOSEC) 40 MG capsule, Take by mouth daily., Disp: , Rfl: 6  No Known Allergies Review of Systems Objective:   Vitals:   05/09/18 0948  BP: 135/79  Pulse: 73  Resp: 16    General: Well developed, nourished, in no acute distress, alert and oriented x3   Dermatological: Skin is warm, dry and supple bilateral. Nails x 10 are not well  maintained; remaining integument appears unremarkable at this time. There are no open sores, no preulcerative lesions, no rash or signs of infection present.  Multiple porokeratosis forefoot and rear foot bilaterally.  No open lesions or wounds.  Vascular: Dorsalis Pedis artery and Posterior Tibial artery pedal pulses are 2/4 bilateral with immedate capillary fill time. Pedal hair growth present. No varicosities and no lower extremity edema present bilateral.   Neruologic: Grossly intact via light touch bilateral. Vibratory intact via tuning fork bilateral. Protective threshold with Semmes Wienstein monofilament intact to all pedal sites bilateral. Patellar and Achilles deep tendon reflexes 2+ bilateral. No Babinski or clonus noted bilateral.   Musculoskeletal: No gross boney pedal deformities bilateral. No pain, crepitus, or limitation noted with foot and ankle range of motion bilateral. Muscular strength 5/5 in all groups tested bilateral.  Gait: Unassisted, Nonantalgic.    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  Plan: Debridement of all of porokeratotic lesions and then chemical debridement salicylic acid to be left under good under occlusion for 3 days and then washed off thoroughly.  He will follow-up with Dr. Adah Perl or Dr. Prudence Davidson for routine debridement.     Dang Mathison T. [place], [place]

## 2018-07-12 ENCOUNTER — Ambulatory Visit: Payer: Medicare HMO | Admitting: Podiatry

## 2018-07-31 DIAGNOSIS — F4310 Post-traumatic stress disorder, unspecified: Secondary | ICD-10-CM | POA: Insufficient documentation

## 2018-12-04 HISTORY — PX: UROFLOWMETRY SIMPLE / COMPLEX: SUR1429

## 2020-05-17 ENCOUNTER — Encounter (HOSPITAL_COMMUNITY): Payer: Self-pay | Admitting: Emergency Medicine

## 2020-05-17 ENCOUNTER — Emergency Department (HOSPITAL_COMMUNITY)
Admission: EM | Admit: 2020-05-17 | Discharge: 2020-05-18 | Disposition: A | Discharge status: Home / Self Care | Payer: Medicare HMO | Attending: Emergency Medicine | Admitting: Emergency Medicine

## 2020-05-17 ENCOUNTER — Other Ambulatory Visit: Payer: Self-pay

## 2020-05-17 DIAGNOSIS — E119 Type 2 diabetes mellitus without complications: Secondary | ICD-10-CM | POA: Insufficient documentation

## 2020-05-17 DIAGNOSIS — Y939 Activity, unspecified: Secondary | ICD-10-CM | POA: Diagnosis not present

## 2020-05-17 DIAGNOSIS — E039 Hypothyroidism, unspecified: Secondary | ICD-10-CM | POA: Insufficient documentation

## 2020-05-17 DIAGNOSIS — Z79899 Other long term (current) drug therapy: Secondary | ICD-10-CM | POA: Diagnosis not present

## 2020-05-17 DIAGNOSIS — Y999 Unspecified external cause status: Secondary | ICD-10-CM | POA: Insufficient documentation

## 2020-05-17 DIAGNOSIS — Z7984 Long term (current) use of oral hypoglycemic drugs: Secondary | ICD-10-CM | POA: Insufficient documentation

## 2020-05-17 DIAGNOSIS — W19XXXA Unspecified fall, initial encounter: Secondary | ICD-10-CM

## 2020-05-17 DIAGNOSIS — R55 Syncope and collapse: Secondary | ICD-10-CM | POA: Diagnosis not present

## 2020-05-17 DIAGNOSIS — Z87891 Personal history of nicotine dependence: Secondary | ICD-10-CM | POA: Diagnosis not present

## 2020-05-17 DIAGNOSIS — Y92239 Unspecified place in hospital as the place of occurrence of the external cause: Secondary | ICD-10-CM | POA: Diagnosis not present

## 2020-05-17 DIAGNOSIS — W010XXA Fall on same level from slipping, tripping and stumbling without subsequent striking against object, initial encounter: Secondary | ICD-10-CM | POA: Diagnosis not present

## 2020-05-17 DIAGNOSIS — I10 Essential (primary) hypertension: Secondary | ICD-10-CM | POA: Diagnosis not present

## 2020-05-17 LAB — CBC
HCT: 41.8 % (ref 39.0–52.0)
Hemoglobin: 13.3 g/dL (ref 13.0–17.0)
MCH: 28.6 pg (ref 26.0–34.0)
MCHC: 31.8 g/dL (ref 30.0–36.0)
MCV: 89.9 fL (ref 80.0–100.0)
Platelets: 315 10*3/uL (ref 150–400)
RBC: 4.65 MIL/uL (ref 4.22–5.81)
RDW: 13.3 % (ref 11.5–15.5)
WBC: 7.8 10*3/uL (ref 4.0–10.5)
nRBC: 0 % (ref 0.0–0.2)

## 2020-05-17 LAB — BASIC METABOLIC PANEL
Anion gap: 11 (ref 5–15)
BUN: 9 mg/dL (ref 8–23)
CO2: 26 mmol/L (ref 22–32)
Calcium: 9.2 mg/dL (ref 8.9–10.3)
Chloride: 101 mmol/L (ref 98–111)
Creatinine, Ser: 1.04 mg/dL (ref 0.61–1.24)
GFR calc Af Amer: >60 mL/min (ref >60)
GFR calc non Af Amer: >60 mL/min (ref >60)
Glucose, Bld: 101 mg/dL — ABNORMAL HIGH (ref 70–99)
Potassium: 3.7 mmol/L (ref 3.5–5.1)
Sodium: 138 mmol/L (ref 135–145)

## 2020-05-17 LAB — URINALYSIS, ROUTINE W REFLEX MICROSCOPIC
Bilirubin Urine: NEGATIVE
Glucose, UA: NEGATIVE mg/dL
Hgb urine dipstick: NEGATIVE
Ketones, ur: NEGATIVE mg/dL
Leukocytes,Ua: NEGATIVE
Nitrite: NEGATIVE
Protein, ur: NEGATIVE mg/dL
Specific Gravity, Urine: 1.013 (ref 1.005–1.030)
pH: 6 (ref 5.0–8.0)

## 2020-05-17 MED ORDER — SODIUM CHLORIDE 0.9% FLUSH
3.0000 mL | Freq: Once | INTRAVENOUS | Status: AC
  Administered 2020-05-18: 3 mL via INTRAVENOUS

## 2020-05-17 NOTE — ED Triage Notes (Signed)
Pt reports a fall in the parking lot, states his legs felt weak causing him to fall. Denies dizziness, hitting his head/LOC.

## 2020-05-18 MED ORDER — SODIUM CHLORIDE 0.9 % IV BOLUS
500.0000 mL | Freq: Once | INTRAVENOUS | Status: AC
  Administered 2020-05-18: 500 mL via INTRAVENOUS

## 2020-05-18 NOTE — ED Provider Notes (Signed)
[facility] EMERGENCY DEPARTMENT Provider Note   CSN: [id number] Arrival date & time: 05/17/20  1632     History Chief Complaint  Patient presents with  . Fall    Melvin Warner is a 69 y.o. male.  Patient presents to the emergency department for evaluation after a fall. Patient has been spending time here at the hospital because his wife is admitted and on the sixth floor. He has been sitting for some time, stood up to walk and felt dizzy and then fell to the ground. Felt like his legs gave out. He reports that bystanders were able to grab him before he fell to the ground, no injury. He did not lose consciousness. He had not been ill prior to the episode. No chest pain, heart palpitations or shortness of breath.        Past Medical History:  Diagnosis Date  . High cholesterol   . Hypertension   . Leg cramps   . Thyroid disease     Patient Active Problem List   Diagnosis Date Noted  . Benign hypertension 05/09/2018  . Hyperlipidemia 05/09/2018  . Hypothyroidism 05/09/2018  . Ruptured suppurative appendicitis Dec 2017 11/07/2016  . Diabetes mellitus ([place]) 07/07/2016    Past Surgical History:  Procedure Laterality Date  . CYST REMOVAL NECK    . LAPAROSCOPIC APPENDECTOMY N/A 11/07/2016   Procedure: APPENDECTOMY LAPAROSCOPIC;  Surgeon: Johnathan Hausen, MD;  Location: [facility];  Service: General;  Laterality: N/A;       No family history on file.  Social History   Tobacco Use  . Smoking status: Former Smoker    Types: Cigarettes  . Smokeless tobacco: Never Used  Substance Use Topics  . Alcohol use: No  . Drug use: No    Home Medications Prior to Admission medications   Medication Sig Start Date End Date Taking? Authorizing Provider  amLODipine (NORVASC) 5 MG tablet Take 5 mg by mouth daily. 04/19/20  Yes [provider]  atorvastatin (LIPITOR) 20 MG tablet Take 20 mg by mouth daily.   Yes [provider]  levothyroxine  (SYNTHROID) 100 MCG tablet Take 100 mcg by mouth daily. 04/19/20  Yes [provider]  lisinopril-hydrochlorothiazide (PRINZIDE,ZESTORETIC) 20-12.5 MG tablet Take 1 tablet by mouth daily.   Yes [provider]  metFORMIN (GLUCOPHAGE) 500 MG tablet Take 500 mg by mouth 2 (two) times daily with a meal.    Yes [provider]  omeprazole (PRILOSEC) 40 MG capsule Take 40 mg by mouth daily.  04/26/18  Yes [provider]  Influenza vac split quadrivalent PF (FLUZONE QUADRIVALENT) 0.5 ML injection Fluzone Quad 2018-19(PF) 60 mcg(15 mcgx4)/0.5 mL intramuscular syringe  TO BE ADMINISTERED BY PHARMACIST FOR IMMUNIZATION    [provider]    Allergies    Patient has no known allergies.  Review of Systems   Review of Systems  Constitutional: Positive for fatigue.  Respiratory: Negative for shortness of breath.   Cardiovascular: Negative for chest pain and palpitations.  Neurological: Positive for dizziness.  All other systems reviewed and are negative.   Physical Exam Updated Vital Signs BP 139/67   Pulse 69   Temp 98.9 F (37.2 C) (Oral)   Resp 17   SpO2 98%   Physical Exam Vitals and nursing note reviewed.  Constitutional:      General: He is not in acute distress.    Appearance: Normal appearance. He is well-developed.  HENT:     Head: Normocephalic and  atraumatic.     Right Ear: Hearing normal.     Left Ear: Hearing normal.     Nose: Nose normal.  Eyes:     Conjunctiva/sclera: Conjunctivae normal.     Pupils: Pupils are equal, round, and reactive to light.  Cardiovascular:     Rate and Rhythm: Regular rhythm.     Heart sounds: S1 normal and S2 normal. No murmur heard.  No friction rub. No gallop.   Pulmonary:     Effort: Pulmonary effort is normal. No respiratory distress.     Breath sounds: Normal breath sounds.  Chest:     Chest wall: No tenderness.  Abdominal:     General: Bowel sounds are normal.     Palpations: Abdomen is  soft.     Tenderness: There is no abdominal tenderness. There is no guarding or rebound. Negative signs include Murphy's sign and McBurney's sign.     Hernia: No hernia is present.  Musculoskeletal:        General: Normal range of motion.     Cervical back: Normal range of motion and neck supple.  Skin:    General: Skin is warm and dry.     Findings: No rash.  Neurological:     Mental Status: He is alert and oriented to person, place, and time.     GCS: GCS eye subscore is 4. GCS verbal subscore is 5. GCS motor subscore is 6.     Cranial Nerves: No cranial nerve deficit.     Sensory: No sensory deficit.     Coordination: Coordination normal.  Psychiatric:        Speech: Speech normal.        Behavior: Behavior normal.        Thought Content: Thought content normal.     ED Results / Procedures / Treatments   Labs (all labs ordered are listed, but only abnormal results are displayed) Labs Reviewed  BASIC METABOLIC PANEL - Abnormal; Notable for the following components:      Result Value   Glucose, Bld 101 (*)    All other components within normal limits  CBC  URINALYSIS, ROUTINE W REFLEX MICROSCOPIC  CBG MONITORING, ED    EKG EKG Interpretation  Date/Time:  Monday May 17 2020 17:01:34 EDT Ventricular Rate:  86 PR Interval:  150 QRS Duration: 90 QT Interval:  360 QTC Calculation: 430 R Axis:   68 Text Interpretation: Normal sinus rhythm T wave abnormality, consider inferior ischemia Abnormal ECG Confirmed by Orpah Greek [phone]) on 05/18/2020 4:41:59 AM   Radiology No results found.  Procedures Procedures (including critical care time)  Medications Ordered in ED Medications  sodium chloride flush (NS) 0.9 % injection 3 mL (3 mLs Intravenous Given 05/18/20 0459)  sodium chloride 0.9 % bolus 500 mL (500 mLs Intravenous New Bag/Given 05/18/20 0459)    ED Course  I have reviewed the triage vital signs and the nursing notes.  Pertinent labs & imaging  results that were available during my care of the patient were reviewed by me and considered in my medical decision making (see chart for details).    MDM Rules/Calculators/A&P                          Patient presents to the emergency department for evaluation after a fall.  Patient reports that his legs got weak and he felt dizzy and he fell.  Bystanders were able to catch him and  there was no injury.  He did not have any cardiac symptoms.  Patient is without complaints currently.  His work-up has been unremarkable.  He has been spending his days here at the hospital visiting his wife who is admitted.  He admits that he has not been eating or drinking much, as he has been spending all of his energy making sure she is okay.  He was given some IV fluids here in the emergency department.  Orthostatic vital signs are normal.  Patient is back to his normal baseline, does not require any further work-up.  Final Clinical Impression(s) / ED Diagnoses Final diagnoses:  Fall, initial encounter  Near syncope    Rx / DC Orders ED Discharge Orders    None       Zayvian Mcmurtry, Gwenyth Allegra, MD 05/18/20 [id number]

## 2021-06-08 ENCOUNTER — Ambulatory Visit: Payer: Medicare HMO | Admitting: Podiatry

## 2021-06-08 ENCOUNTER — Encounter: Payer: Self-pay | Admitting: Podiatry

## 2021-06-08 ENCOUNTER — Other Ambulatory Visit: Payer: Self-pay

## 2021-06-08 DIAGNOSIS — E1169 Type 2 diabetes mellitus with other specified complication: Secondary | ICD-10-CM

## 2021-06-08 DIAGNOSIS — Q828 Other specified congenital malformations of skin: Secondary | ICD-10-CM | POA: Insufficient documentation

## 2021-06-08 DIAGNOSIS — M79675 Pain in left toe(s): Secondary | ICD-10-CM | POA: Insufficient documentation

## 2021-06-08 NOTE — Progress Notes (Signed)
This patient returns to my office for at risk foot care.  This patient requires this care by a professional since this patient will be at risk due to having diabetes mellitus.    This patient is unable to cut nails himself since the patient cannot reach his nails.These nails are painful walking and wearing shoes.   Patient has painful callus under the forefoot  both feet.  The callus is painful walking and wearing his shoes. Patient is accompanied by male caregiver.This patient presents for at risk foot care today.  General Appearance  Alert, conversant and in no acute stress.  Vascular  Dorsalis pedis and posterior tibial  pulses are palpable  bilaterally.  Capillary return is within normal limits  bilaterally. Temperature is within normal limits  bilaterally.  Neurologic  Senn-Weinstein monofilament wire test within normal limits  bilaterally. Muscle power within normal limits bilaterally.  Nails Thick disfigured discolored nails with subungual debris  from hallux to fifth toes bilaterally. No evidence of bacterial infection or drainage bilaterally.  Orthopedic  No limitations of motion  feet .  No crepitus or effusions noted.  No bony pathology or digital deformities noted.  Skin  normotropic skin noted bilaterally.  No signs of infections or ulcers noted.   Porokeratotic lesions sub 1/5 left foot and sub 5  right foot.  Porokeratosis  B/L  Consent was obtained for treatment procedures. IE.  Debride callus with # 15 blade.  Filed with dremel without incident.    Return office visit   3 months                  Told patient to return for periodic foot care and evaluation due to potential at risk complications.   Gardiner Barefoot DPM

## 2021-09-19 ENCOUNTER — Encounter: Payer: Self-pay | Admitting: Podiatry

## 2021-09-19 ENCOUNTER — Ambulatory Visit (INDEPENDENT_AMBULATORY_CARE_PROVIDER_SITE_OTHER): Payer: Medicare HMO | Admitting: Podiatry

## 2021-09-19 ENCOUNTER — Other Ambulatory Visit: Payer: Self-pay

## 2021-09-19 DIAGNOSIS — M79675 Pain in left toe(s): Secondary | ICD-10-CM | POA: Diagnosis not present

## 2021-09-19 DIAGNOSIS — Q828 Other specified congenital malformations of skin: Secondary | ICD-10-CM | POA: Diagnosis not present

## 2021-09-19 DIAGNOSIS — E1169 Type 2 diabetes mellitus with other specified complication: Secondary | ICD-10-CM | POA: Diagnosis not present

## 2021-09-19 DIAGNOSIS — B351 Tinea unguium: Secondary | ICD-10-CM

## 2021-09-19 DIAGNOSIS — M79674 Pain in right toe(s): Secondary | ICD-10-CM | POA: Diagnosis not present

## 2021-09-19 NOTE — Progress Notes (Signed)
This patient returns to my office for at risk foot care.  This patient requires this care by a professional since this patient will be at risk due to having diabetes mellitus.    This patient is unable to cut nails himself since the patient cannot reach his nails.These nails are painful walking and wearing shoes.   Patient has painful callus under the forefoot  both feet.  The callus is painful walking and wearing his shoes. Patient is accompanied by male caregiver.This patient presents for at risk foot care today.  General Appearance  Alert, conversant and in no acute stress.  Vascular  Dorsalis pedis and posterior tibial  pulses are palpable  bilaterally.  Capillary return is within normal limits  bilaterally. Temperature is within normal limits  bilaterally.  Neurologic  Senn-Weinstein monofilament wire test within normal limits  bilaterally. Muscle power within normal limits bilaterally.  Nails Thick disfigured discolored nails with subungual debris  from hallux to fifth toes bilaterally. No evidence of bacterial infection or drainage bilaterally.  Orthopedic  No limitations of motion  feet .  No crepitus or effusions noted.  No bony pathology or digital deformities noted.  Skin  normotropic skin noted bilaterally.  No signs of infections or ulcers noted.   Porokeratotic lesions sub 1/5 left foot and sub 5  right foot.  Porokeratosis  B/L  Consent was obtained for treatment procedures.  Debride callus with # 15 blade.  Filed with dremel without incident.    Return office visit   3 months                  Told patient to return for periodic foot care and evaluation due to potential at risk complications.   Gardiner Barefoot DPM

## 2021-12-05 ENCOUNTER — Other Ambulatory Visit: Payer: Self-pay

## 2021-12-05 ENCOUNTER — Encounter (HOSPITAL_COMMUNITY): Payer: Self-pay | Admitting: Emergency Medicine

## 2021-12-05 ENCOUNTER — Emergency Department (HOSPITAL_COMMUNITY): Payer: Medicare HMO

## 2021-12-05 ENCOUNTER — Emergency Department (HOSPITAL_COMMUNITY)
Admission: EM | Admit: 2021-12-05 | Discharge: 2021-12-05 | Disposition: A | Discharge status: Home / Self Care | Payer: Medicare HMO | Attending: Emergency Medicine | Admitting: Emergency Medicine

## 2021-12-05 DIAGNOSIS — F419 Anxiety disorder, unspecified: Secondary | ICD-10-CM | POA: Insufficient documentation

## 2021-12-05 DIAGNOSIS — E039 Hypothyroidism, unspecified: Secondary | ICD-10-CM | POA: Insufficient documentation

## 2021-12-05 DIAGNOSIS — Z7984 Long term (current) use of oral hypoglycemic drugs: Secondary | ICD-10-CM | POA: Diagnosis not present

## 2021-12-05 DIAGNOSIS — Z79899 Other long term (current) drug therapy: Secondary | ICD-10-CM | POA: Insufficient documentation

## 2021-12-05 DIAGNOSIS — E119 Type 2 diabetes mellitus without complications: Secondary | ICD-10-CM | POA: Diagnosis not present

## 2021-12-05 DIAGNOSIS — R42 Dizziness and giddiness: Secondary | ICD-10-CM | POA: Diagnosis present

## 2021-12-05 DIAGNOSIS — I10 Essential (primary) hypertension: Secondary | ICD-10-CM | POA: Diagnosis not present

## 2021-12-05 LAB — CBC
HCT: 45.8 % (ref 39.0–52.0)
Hemoglobin: 15 g/dL (ref 13.0–17.0)
MCH: 28.5 pg (ref 26.0–34.0)
MCHC: 32.8 g/dL (ref 30.0–36.0)
MCV: 86.9 fL (ref 80.0–100.0)
Platelets: 318 10*3/uL (ref 150–400)
RBC: 5.27 MIL/uL (ref 4.22–5.81)
RDW: 13.5 % (ref 11.5–15.5)
WBC: 5.1 10*3/uL (ref 4.0–10.5)
nRBC: 0 % (ref 0.0–0.2)

## 2021-12-05 LAB — URINALYSIS, ROUTINE W REFLEX MICROSCOPIC
Bilirubin Urine: NEGATIVE
Glucose, UA: 50 mg/dL — AB
Hgb urine dipstick: NEGATIVE
Ketones, ur: NEGATIVE mg/dL
Leukocytes,Ua: NEGATIVE
Nitrite: NEGATIVE
Protein, ur: NEGATIVE mg/dL
Specific Gravity, Urine: 1.017 (ref 1.005–1.030)
pH: 6 (ref 5.0–8.0)

## 2021-12-05 LAB — BASIC METABOLIC PANEL
Anion gap: 10 (ref 5–15)
BUN: 14 mg/dL (ref 8–23)
CO2: 24 mmol/L (ref 22–32)
Calcium: 9.3 mg/dL (ref 8.9–10.3)
Chloride: 105 mmol/L (ref 98–111)
Creatinine, Ser: 1.03 mg/dL (ref 0.61–1.24)
GFR, Estimated: >60 mL/min (ref >60)
Glucose, Bld: 149 mg/dL — ABNORMAL HIGH (ref 70–99)
Potassium: 4 mmol/L (ref 3.5–5.1)
Sodium: 139 mmol/L (ref 135–145)

## 2021-12-05 NOTE — ED Triage Notes (Signed)
Patient coming from home, complaint of anxiety and dizziness for several days. States he stopped drinking alcohol 2-3 months ago and started drinking coffee, pt states thinks it may have to do with drinking too much coffee.

## 2021-12-05 NOTE — Discharge Instructions (Addendum)
It was a pleasure taking care of you today!  Your work-up was negative in the ED.  Your CT of your head did not show any acute findings.  Your labs and urine did not show any acute findings.  Your EKG was without acute findings.   You may take your at home prescription medications as prescribed.  You may follow-up with your primary care provider as needed.  Return to the ED if you are experiencing increasing/worsening chest pain, trouble breathing, vision changes, or worsening symptoms.

## 2021-12-05 NOTE — ED Provider Notes (Signed)
Emergency Medicine Provider Triage Evaluation Note  Melvin Warner , a 71 y.o. male  was evaluated in triage.  Pt with a history of hypertension and diabetes presenting with 3 to 4 days of intermittent dizziness and visual changes.  This appears to be brought on when he stands up too quickly but yesterday he was scared because he was having difficulty concentrating and felt as though his vision was decreasing.  Has not been checking his blood pressure at home and does not know whether or not it is controlled.  Review of Systems  Positive: As above Negative: Syncope or headaches or chest pain  Physical Exam  BP (!) 171/80 (BP Location: Left Arm)    Pulse 89    Temp 97.7 F (36.5 C) (Oral)    Resp 20    Ht 5\' 8"  (1.727 m)    Wt 77 kg    SpO2 99%    BMI 25.81 kg/m  Gen:   Awake, no distress   Resp:  Normal effort  MSK:   Moves extremities without difficulty  Other:  Cranial nerves II through XII intact.  5 out of 5 strength in upper and lower extremities.  Very slow finger-to-nose testing and heel shin bilaterally.  Pupils equal, round and reactive to light however pin point.  Medical Decision Making  Medically screening exam initiated at 10:01 AM.  Appropriate orders placed.  HILL MACKIE was informed that the remainder of the evaluation will be completed by another provider, this initial triage assessment does not replace that evaluation, and the importance of remaining in the ED until their evaluation is complete.     Rhae Hammock, PA-C 12/05/21 1003    Godfrey Pick, MD 12/07/21 [phone]

## 2021-12-05 NOTE — ED Provider Notes (Signed)
[facility] EMERGENCY DEPARTMENT Provider Note   CSN: [id number] Arrival date & time: 12/05/21  0938     History  Chief Complaint  Patient presents with   Anxiety   Dizziness    Melvin Warner is a 71 y.o. male with a past medical history of hypertension, diabetes who presents to the ED complaining of intermittent dizziness onset 3-4 days.  He notes that his dizziness occurs when he stands up abruptly.  Yesterday he noted that he was having visual changes, these are resolved today.  He has not tried any medications for his symptoms.  Patient denies illicit drugs or alcohol use.  He notes that he recently started drinking coffee and consumes up to 4 cups a day.  Denies allergies to medications.  Denies history of similar symptoms.  Denies chest pain, shortness of breath, fever, chills, abdominal pain, nausea, vomiting, vision changes.  Patient notes that he took his Losartan prior to the arrival to the ED.   Past Medical History:  Diagnosis Date   High cholesterol    Hypertension    Leg cramps    Thyroid disease      The history is provided by the patient. No language interpreter was used.      Home Medications Prior to Admission medications   Medication Sig Start Date End Date Taking? Authorizing Provider  amLODipine (NORVASC) 5 MG tablet Take 5 mg by mouth daily. 04/19/20   [provider]  atorvastatin (LIPITOR) 20 MG tablet Take 20 mg by mouth daily.    [provider]  influenza vac split quadrivalent PF (FLUARIX) 0.5 ML injection Fluzone Quad 2018-19(PF) 60 mcg(15 mcgx4)/0.5 mL intramuscular syringe  TO BE ADMINISTERED BY PHARMACIST FOR IMMUNIZATION    [provider]  levothyroxine (SYNTHROID) 100 MCG tablet Take 100 mcg by mouth daily. 04/19/20   [provider]  lisinopril-hydrochlorothiazide (PRINZIDE,ZESTORETIC) 20-12.5 MG tablet Take 1 tablet by mouth daily.    [provider]  metFORMIN (GLUCOPHAGE) 500  MG tablet Take 500 mg by mouth 2 (two) times daily with a meal.     [provider]  omeprazole (PRILOSEC) 40 MG capsule Take 40 mg by mouth daily.  04/26/18   [provider]      Allergies    Patient has no known allergies.    Review of Systems   Review of Systems  Constitutional:  Negative for chills and fever.  Eyes:  Negative for visual disturbance.  Respiratory:  Negative for cough and shortness of breath.   Cardiovascular:  Negative for chest pain.  Gastrointestinal:  Negative for abdominal pain, nausea and vomiting.  Neurological:  Positive for dizziness (resolved).  All other systems reviewed and are negative.  Physical Exam Updated Vital Signs BP (!) 173/86 (BP Location: Left Arm)    Pulse 72    Temp 98.3 F (36.8 C)    Resp 14    Ht 5\' 8"  (1.727 m)    Wt 77 kg    SpO2 99%    BMI 25.81 kg/m  Physical Exam Vitals and nursing note reviewed.  Constitutional:      General: He is not in acute distress.    Appearance: He is not diaphoretic.  HENT:     Head: Normocephalic and atraumatic.     Mouth/Throat:     Pharynx: No oropharyngeal exudate.  Eyes:     General: No scleral icterus.    Conjunctiva/sclera: Conjunctivae normal.  Cardiovascular:     Rate  and Rhythm: Normal rate and regular rhythm.     Pulses: Normal pulses.     Heart sounds: Normal heart sounds.  Pulmonary:     Effort: Pulmonary effort is normal. No respiratory distress.     Breath sounds: Normal breath sounds. No wheezing.  Chest:     Comments: No chest wall tenderness to palpation. Abdominal:     General: Bowel sounds are normal.     Palpations: Abdomen is soft. There is no mass.     Tenderness: There is no abdominal tenderness. There is no guarding or rebound.  Musculoskeletal:        General: Normal range of motion.     Cervical back: Normal range of motion and neck supple.     Comments: Strength and sensation intact to bilateral upper and lower extremities.  Skin:    General:  Skin is warm and dry.  Neurological:     Mental Status: He is alert.  Psychiatric:        Behavior: Behavior normal.    ED Results / Procedures / Treatments   Labs (all labs ordered are listed, but only abnormal results are displayed) Labs Reviewed  BASIC METABOLIC PANEL - Abnormal; Notable for the following components:      Result Value   Glucose, Bld 149 (*)    All other components within normal limits  URINALYSIS, ROUTINE W REFLEX MICROSCOPIC - Abnormal; Notable for the following components:   Glucose, UA 50 (*)    All other components within normal limits  CBC  CBG MONITORING, ED    EKG None  Radiology CT Head Wo Contrast  Result Date: 12/05/2021 CLINICAL DATA:  Evaluate for stroke. Neuro deficit. Complains of anxiety and dizziness for several days. EXAM: CT HEAD WITHOUT CONTRAST TECHNIQUE: Contiguous axial images were obtained from the base of the skull through the vertex without intravenous contrast. COMPARISON:  None. FINDINGS: Brain: No evidence of acute infarction, hemorrhage, hydrocephalus, extra-axial collection or mass lesion/mass effect. Prominence of the sulci and ventricles compatible with brain atrophy. Mega cisterna magna noted. Vascular: No hyperdense vessel or unexpected calcification. Skull: Normal. Negative for fracture or focal lesion. Sinuses/Orbits: No acute finding. Other: None. IMPRESSION: 1. No acute intracranial abnormalities. 2. Brain atrophy. Electronically Signed   By: Kerby Moors M.D.   On: 12/05/2021 11:04    Procedures Procedures    Medications Ordered in ED Medications - No data to display  ED Course/ Medical Decision Making/ A&P                            Medical Decision Making  This patient presents to the ED for concern of anxiety and resolved dizziness onset 3-4 days, this involves an extensive number of treatment options, and is a complaint that carries with it a high risk of complications and morbidity.  The differential diagnosis  includes anemia, electrolyte abnormality, caffeine use.    Co morbidities that complicate the patient evaluation HTN, HLD, hypothyroidism   Lab Tests: I ordered, and personally interpreted labs.  The pertinent results include:  BMP, CBC, urinalysis unremarkable.  EKG:  EKG unremarkable in the ED.  No acute ST/T changes.  Imaging Studies ordered: I ordered imaging studies including CT head wo contrast I independently visualized and interpreted imaging which showed no intracranial abnormality I agree with the radiologist interpretation  Problem List / ED Course: Dizziness: Patient with dizziness, resolved in the ED.  Patient denies alcohol use, illicit  drug use.  He notes that he has been consuming caffeine which he recently started.  He drinks up to 4 cups of coffee a day, again all of which is new for him.  He reports after drinking the caffeine that he feels increased anxiety and dizziness.  Patient presentation suspicious for elevated caffeine use as a cause of his dizziness at this time.  Reevaluation: After the interventions noted above, I reevaluated the patient and found that they have :resolved  Social Determinants of Health: Patient lives at home with his wife who he takes care of.  Dispostion: After consideration of the diagnostic results and the patients response to treatment, I feel that the patent would benefit from being discharged home with close follow-up with primary care provider.  Also discussed with patient importance of decreasing caffeine intake to decrease symptom onset.  Patient appears safe for discharge.  This chart was dictated using voice recognition software, Dragon. Despite the best efforts of this provider to proofread and correct errors, errors may still occur which can change documentation meaning.  Final Clinical Impression(s) / ED Diagnoses Final diagnoses:  Dizziness    Rx / DC Orders ED Discharge Orders     None         Skylyn Slezak  A, PA-C 12/05/21 1616    Horton, Alvin Critchley, DO 12/12/21 1455

## 2021-12-05 NOTE — ED Notes (Signed)
RN reviewed discharge instructions w/ pt and his caregiver, Butch Penny. Results and follow up reviewed, pt and Butch Penny had no further questions

## 2021-12-26 ENCOUNTER — Other Ambulatory Visit: Payer: Self-pay

## 2021-12-26 ENCOUNTER — Encounter: Payer: Self-pay | Admitting: Podiatry

## 2021-12-26 ENCOUNTER — Ambulatory Visit: Payer: Medicare HMO | Admitting: Podiatry

## 2021-12-26 DIAGNOSIS — M79675 Pain in left toe(s): Secondary | ICD-10-CM

## 2021-12-26 DIAGNOSIS — Q828 Other specified congenital malformations of skin: Secondary | ICD-10-CM | POA: Diagnosis not present

## 2021-12-26 DIAGNOSIS — B351 Tinea unguium: Secondary | ICD-10-CM

## 2021-12-26 DIAGNOSIS — M79674 Pain in right toe(s): Secondary | ICD-10-CM

## 2021-12-26 DIAGNOSIS — E1169 Type 2 diabetes mellitus with other specified complication: Secondary | ICD-10-CM | POA: Diagnosis not present

## 2021-12-26 NOTE — Progress Notes (Signed)
This patient returns to my office for at risk foot care.  This patient requires this care by a professional since this patient will be at risk due to having diabetes mellitus.    This patient is unable to cut nails himself since the patient cannot reach his nails.These nails are painful walking and wearing shoes.   Patient has painful callus under the forefoot  both feet.  The callus is painful walking and wearing his shoes. Patient is accompanied by male caregiver.This patient presents for at risk foot care today.  General Appearance  Alert, conversant and in no acute stress.  Vascular  Dorsalis pedis and posterior tibial  pulses are palpable  bilaterally.  Capillary return is within normal limits  bilaterally. Temperature is within normal limits  bilaterally.  Neurologic  Senn-Weinstein monofilament wire test within normal limits  bilaterally. Muscle power within normal limits bilaterally.  Nails Thick disfigured discolored nails with subungual debris  from hallux to fifth toes bilaterally. No evidence of bacterial infection or drainage bilaterally.  Orthopedic  No limitations of motion  feet .  No crepitus or effusions noted.  No bony pathology or digital deformities noted.  Skin  normotropic skin noted bilaterally.  No signs of infections or ulcers noted.   Porokeratotic lesions sub 1/4 left foot and sub 1,4  right foot.  Porokeratosis  B/L  Consent was obtained for treatment procedures.  Debride callus with # 15 blade. Nails performed as a courtesy. Filed with dremel without incident.    Return office visit   3 months                  Told patient to return for periodic foot care and evaluation due to potential at risk complications.   Jaymin Waln DPM   

## 2022-01-24 DIAGNOSIS — R351 Nocturia: Secondary | ICD-10-CM

## 2022-01-24 HISTORY — DX: Nocturia: R35.1

## 2022-01-25 ENCOUNTER — Other Ambulatory Visit: Payer: Self-pay | Admitting: Urology

## 2022-01-25 DIAGNOSIS — R972 Elevated prostate specific antigen [PSA]: Secondary | ICD-10-CM

## 2022-01-25 DIAGNOSIS — Z125 Encounter for screening for malignant neoplasm of prostate: Secondary | ICD-10-CM

## 2022-03-28 ENCOUNTER — Other Ambulatory Visit: Payer: Medicare HMO

## 2022-04-03 ENCOUNTER — Ambulatory Visit: Payer: Medicare HMO | Admitting: Podiatry

## 2022-04-03 ENCOUNTER — Encounter: Payer: Self-pay | Admitting: Podiatry

## 2022-04-03 DIAGNOSIS — Q828 Other specified congenital malformations of skin: Secondary | ICD-10-CM

## 2022-04-03 DIAGNOSIS — E782 Mixed hyperlipidemia: Secondary | ICD-10-CM | POA: Diagnosis not present

## 2022-04-03 DIAGNOSIS — E1169 Type 2 diabetes mellitus with other specified complication: Secondary | ICD-10-CM

## 2022-04-03 DIAGNOSIS — E039 Hypothyroidism, unspecified: Secondary | ICD-10-CM | POA: Diagnosis not present

## 2022-04-03 NOTE — Progress Notes (Signed)
This patient returns to my office for at risk foot care.  This patient requires this care by a professional since this patient will be at risk due to having diabetes mellitus.    This patient is unable to cut nails himself since the patient cannot reach his nails.These nails are painful walking and wearing shoes.   Patient has painful callus under the forefoot  both feet.  The callus is painful walking and wearing his shoes. Patient is accompanied by male caregiver.This patient presents for at risk foot care today.  General Appearance  Alert, conversant and in no acute stress.  Vascular  Dorsalis pedis and posterior tibial  pulses are palpable  bilaterally.  Capillary return is within normal limits  bilaterally. Temperature is within normal limits  bilaterally.  Neurologic  Senn-Weinstein monofilament wire test within normal limits  bilaterally. Muscle power within normal limits bilaterally.  Nails Thick disfigured discolored nails with subungual debris  from hallux to fifth toes bilaterally. No evidence of bacterial infection or drainage bilaterally.  Orthopedic  No limitations of motion  feet .  No crepitus or effusions noted.  No bony pathology or digital deformities noted.  Skin  normotropic skin noted bilaterally.  No signs of infections or ulcers noted.   Porokeratotic lesions sub 1/4 left foot and sub 1,4  right foot.  Porokeratosis  B/L  Consent was obtained for treatment procedures.  Debride callus with # 15 blade. Nails performed as a courtesy. Filed with dremel without incident.    Return office visit   3 months                  Told patient to return for periodic foot care and evaluation due to potential at risk complications.   Bingham Millette DPM   

## 2022-04-12 ENCOUNTER — Ambulatory Visit
Admission: RE | Admit: 2022-04-12 | Discharge: 2022-04-12 | Disposition: A | Discharge status: Home / Self Care | Payer: Medicare HMO | Source: Ambulatory Visit | Attending: Urology | Admitting: Urology

## 2022-04-12 DIAGNOSIS — F4310 Post-traumatic stress disorder, unspecified: Secondary | ICD-10-CM | POA: Diagnosis not present

## 2022-04-12 DIAGNOSIS — I10 Essential (primary) hypertension: Secondary | ICD-10-CM | POA: Diagnosis not present

## 2022-04-12 DIAGNOSIS — E782 Mixed hyperlipidemia: Secondary | ICD-10-CM | POA: Diagnosis not present

## 2022-04-12 DIAGNOSIS — R590 Localized enlarged lymph nodes: Secondary | ICD-10-CM | POA: Diagnosis not present

## 2022-04-12 DIAGNOSIS — K219 Gastro-esophageal reflux disease without esophagitis: Secondary | ICD-10-CM | POA: Diagnosis not present

## 2022-04-12 DIAGNOSIS — E559 Vitamin D deficiency, unspecified: Secondary | ICD-10-CM | POA: Diagnosis not present

## 2022-04-12 DIAGNOSIS — E1169 Type 2 diabetes mellitus with other specified complication: Secondary | ICD-10-CM | POA: Diagnosis not present

## 2022-04-12 DIAGNOSIS — R972 Elevated prostate specific antigen [PSA]: Secondary | ICD-10-CM

## 2022-04-12 DIAGNOSIS — N401 Enlarged prostate with lower urinary tract symptoms: Secondary | ICD-10-CM | POA: Diagnosis not present

## 2022-04-12 DIAGNOSIS — F419 Anxiety disorder, unspecified: Secondary | ICD-10-CM | POA: Diagnosis not present

## 2022-04-12 DIAGNOSIS — Z125 Encounter for screening for malignant neoplasm of prostate: Secondary | ICD-10-CM

## 2022-04-12 DIAGNOSIS — N400 Enlarged prostate without lower urinary tract symptoms: Secondary | ICD-10-CM | POA: Diagnosis not present

## 2022-04-12 DIAGNOSIS — E039 Hypothyroidism, unspecified: Secondary | ICD-10-CM | POA: Diagnosis not present

## 2022-04-12 MED ORDER — GADOBENATE DIMEGLUMINE 529 MG/ML IV SOLN
17.0000 mL | Freq: Once | INTRAVENOUS | Status: AC | PRN
  Administered 2022-04-12: 17 mL via INTRAVENOUS

## 2022-04-13 DIAGNOSIS — R972 Elevated prostate specific antigen [PSA]: Secondary | ICD-10-CM | POA: Diagnosis not present

## 2022-04-13 DIAGNOSIS — R3912 Poor urinary stream: Secondary | ICD-10-CM | POA: Diagnosis not present

## 2022-04-13 DIAGNOSIS — N401 Enlarged prostate with lower urinary tract symptoms: Secondary | ICD-10-CM | POA: Diagnosis not present

## 2022-04-13 HISTORY — DX: Poor urinary stream: R39.12

## 2022-07-18 DIAGNOSIS — E039 Hypothyroidism, unspecified: Secondary | ICD-10-CM | POA: Diagnosis not present

## 2022-07-18 DIAGNOSIS — E1169 Type 2 diabetes mellitus with other specified complication: Secondary | ICD-10-CM | POA: Diagnosis not present

## 2022-07-18 DIAGNOSIS — E782 Mixed hyperlipidemia: Secondary | ICD-10-CM | POA: Diagnosis not present

## 2022-07-24 ENCOUNTER — Ambulatory Visit: Payer: Medicare HMO | Admitting: Podiatry

## 2022-07-24 DIAGNOSIS — E1169 Type 2 diabetes mellitus with other specified complication: Secondary | ICD-10-CM | POA: Diagnosis not present

## 2022-07-24 DIAGNOSIS — F4310 Post-traumatic stress disorder, unspecified: Secondary | ICD-10-CM | POA: Diagnosis not present

## 2022-07-24 DIAGNOSIS — K219 Gastro-esophageal reflux disease without esophagitis: Secondary | ICD-10-CM | POA: Diagnosis not present

## 2022-07-24 DIAGNOSIS — E559 Vitamin D deficiency, unspecified: Secondary | ICD-10-CM | POA: Diagnosis not present

## 2022-07-24 DIAGNOSIS — I10 Essential (primary) hypertension: Secondary | ICD-10-CM | POA: Diagnosis not present

## 2022-07-24 DIAGNOSIS — F411 Generalized anxiety disorder: Secondary | ICD-10-CM | POA: Diagnosis not present

## 2022-07-24 DIAGNOSIS — E782 Mixed hyperlipidemia: Secondary | ICD-10-CM | POA: Diagnosis not present

## 2022-07-24 DIAGNOSIS — N401 Enlarged prostate with lower urinary tract symptoms: Secondary | ICD-10-CM | POA: Diagnosis not present

## 2022-07-24 DIAGNOSIS — E039 Hypothyroidism, unspecified: Secondary | ICD-10-CM | POA: Diagnosis not present

## 2022-08-11 ENCOUNTER — Ambulatory Visit: Payer: Medicare HMO | Admitting: Podiatry

## 2022-08-23 DIAGNOSIS — H2513 Age-related nuclear cataract, bilateral: Secondary | ICD-10-CM | POA: Diagnosis not present

## 2022-08-23 DIAGNOSIS — H35033 Hypertensive retinopathy, bilateral: Secondary | ICD-10-CM | POA: Diagnosis not present

## 2022-08-23 DIAGNOSIS — E119 Type 2 diabetes mellitus without complications: Secondary | ICD-10-CM | POA: Diagnosis not present

## 2022-08-23 DIAGNOSIS — I10 Essential (primary) hypertension: Secondary | ICD-10-CM | POA: Diagnosis not present

## 2022-08-31 DIAGNOSIS — H524 Presbyopia: Secondary | ICD-10-CM | POA: Diagnosis not present

## 2022-09-01 ENCOUNTER — Ambulatory Visit: Payer: Medicare HMO | Admitting: Podiatry

## 2022-09-01 ENCOUNTER — Encounter: Payer: Self-pay | Admitting: Podiatry

## 2022-09-01 DIAGNOSIS — E1169 Type 2 diabetes mellitus with other specified complication: Secondary | ICD-10-CM

## 2022-09-01 DIAGNOSIS — M79674 Pain in right toe(s): Secondary | ICD-10-CM | POA: Diagnosis not present

## 2022-09-01 DIAGNOSIS — Q828 Other specified congenital malformations of skin: Secondary | ICD-10-CM

## 2022-09-01 DIAGNOSIS — M79675 Pain in left toe(s): Secondary | ICD-10-CM | POA: Diagnosis not present

## 2022-09-01 DIAGNOSIS — B351 Tinea unguium: Secondary | ICD-10-CM | POA: Diagnosis not present

## 2022-09-01 NOTE — Progress Notes (Signed)
This patient returns to my office for at risk foot care.  This patient requires this care by a professional since this patient will be at risk due to having diabetes mellitus.    This patient is unable to cut nails himself since the patient cannot reach his nails.These nails are painful walking and wearing shoes.   Patient has painful callus under the forefoot  both feet.  The callus is painful walking and wearing his shoes. Patient is accompanied by male caregiver.This patient presents for at risk foot care today.  General Appearance  Alert, conversant and in no acute stress.  Vascular  Dorsalis pedis and posterior tibial  pulses are palpable  bilaterally.  Capillary return is within normal limits  bilaterally. Temperature is within normal limits  bilaterally.  Neurologic  Senn-Weinstein monofilament wire test within normal limits  bilaterally. Muscle power within normal limits bilaterally.  Nails Thick disfigured discolored nails with subungual debris  from hallux to fifth toes bilaterally. No evidence of bacterial infection or drainage bilaterally.  Orthopedic  No limitations of motion  feet .  No crepitus or effusions noted.  No bony pathology or digital deformities noted.  Skin  normotropic skin noted bilaterally.  No signs of infections or ulcers noted.   Porokeratotic lesions sub 1/4 left foot and sub 1,4  right foot.  Porokeratosis  B/L  Consent was obtained for treatment procedures.  Debride callus with # 15 blade. Nails performed as a courtesy. Filed with dremel without incident.    Return office visit   3 months                  Told patient to return for periodic foot care and evaluation due to potential at risk complications.   Gardiner Barefoot DPM

## 2022-09-15 DIAGNOSIS — Z0100 Encounter for examination of eyes and vision without abnormal findings: Secondary | ICD-10-CM | POA: Diagnosis not present

## 2022-11-20 DIAGNOSIS — E039 Hypothyroidism, unspecified: Secondary | ICD-10-CM | POA: Diagnosis not present

## 2022-11-20 DIAGNOSIS — E1169 Type 2 diabetes mellitus with other specified complication: Secondary | ICD-10-CM | POA: Diagnosis not present

## 2022-11-20 DIAGNOSIS — E782 Mixed hyperlipidemia: Secondary | ICD-10-CM | POA: Diagnosis not present

## 2022-11-24 DIAGNOSIS — Z0001 Encounter for general adult medical examination with abnormal findings: Secondary | ICD-10-CM | POA: Diagnosis not present

## 2022-11-24 DIAGNOSIS — N401 Enlarged prostate with lower urinary tract symptoms: Secondary | ICD-10-CM | POA: Diagnosis not present

## 2022-11-24 DIAGNOSIS — E119 Type 2 diabetes mellitus without complications: Secondary | ICD-10-CM | POA: Diagnosis not present

## 2022-11-24 DIAGNOSIS — I10 Essential (primary) hypertension: Secondary | ICD-10-CM | POA: Diagnosis not present

## 2022-11-24 DIAGNOSIS — E1169 Type 2 diabetes mellitus with other specified complication: Secondary | ICD-10-CM | POA: Diagnosis not present

## 2022-11-24 DIAGNOSIS — E782 Mixed hyperlipidemia: Secondary | ICD-10-CM | POA: Diagnosis not present

## 2022-11-24 DIAGNOSIS — E039 Hypothyroidism, unspecified: Secondary | ICD-10-CM | POA: Diagnosis not present

## 2022-11-24 DIAGNOSIS — F411 Generalized anxiety disorder: Secondary | ICD-10-CM | POA: Diagnosis not present

## 2022-11-24 DIAGNOSIS — E559 Vitamin D deficiency, unspecified: Secondary | ICD-10-CM | POA: Diagnosis not present

## 2022-12-06 ENCOUNTER — Encounter: Payer: Self-pay | Admitting: Podiatry

## 2022-12-06 ENCOUNTER — Ambulatory Visit (INDEPENDENT_AMBULATORY_CARE_PROVIDER_SITE_OTHER): Payer: Medicare HMO | Admitting: Podiatry

## 2022-12-06 DIAGNOSIS — L84 Corns and callosities: Secondary | ICD-10-CM | POA: Diagnosis not present

## 2022-12-06 DIAGNOSIS — M79675 Pain in left toe(s): Secondary | ICD-10-CM | POA: Diagnosis not present

## 2022-12-06 DIAGNOSIS — B351 Tinea unguium: Secondary | ICD-10-CM

## 2022-12-06 DIAGNOSIS — Q828 Other specified congenital malformations of skin: Secondary | ICD-10-CM

## 2022-12-06 DIAGNOSIS — M79674 Pain in right toe(s): Secondary | ICD-10-CM

## 2022-12-06 DIAGNOSIS — E1169 Type 2 diabetes mellitus with other specified complication: Secondary | ICD-10-CM

## 2022-12-06 NOTE — Progress Notes (Signed)
This patient returns to my office for at risk foot care.  This patient requires this care by a professional since this patient will be at risk due to having diabetes mellitus.    This patient is unable to cut nails himself since the patient cannot reach his nails.These nails are painful walking and wearing shoes.   Patient has painful callus under the forefoot  both feet.  The callus is painful walking and wearing his shoes. Patient is accompanied by male caregiver.This patient presents for at risk foot care today.  General Appearance  Alert, conversant and in no acute stress.  Vascular  Dorsalis pedis and posterior tibial  pulses are palpable  bilaterally.  Capillary return is within normal limits  bilaterally. Temperature is within normal limits  bilaterally.  Neurologic  Senn-Weinstein monofilament wire test within normal limits  bilaterally. Muscle power within normal limits bilaterally.  Nails Thick disfigured discolored nails with subungual debris  from hallux to fifth toes bilaterally. No evidence of bacterial infection or drainage bilaterally.  Orthopedic  No limitations of motion  feet .  No crepitus or effusions noted.  No bony pathology or digital deformities noted.  Skin  normotropic skin noted bilaterally.  No signs of infections or ulcers noted.   Porokeratotic lesions sub 1/4 left foot and sub 1  right foot. Corn sub 5th toe left foot.  Porokeratosis  B/L  Consent was obtained for treatment procedures.  Debride callus with # 15 blade. Nails performed as a courtesy. Filed with dremel without incident.    Return office visit   10 months                 Told patient to return for periodic foot care and evaluation due to potential at risk complications.   Gardiner Barefoot DPM

## 2022-12-22 DIAGNOSIS — R972 Elevated prostate specific antigen [PSA]: Secondary | ICD-10-CM | POA: Diagnosis not present

## 2022-12-27 IMAGING — MR MR PROSTATE WO/W CM
14 series · 48 of 48 positions shown · IV contrast (multihance)
Comparison: 11/07/2016 abdominopelvic CT.

CLINICAL DATA: Elevated PSA.  Most recent 8.2.

EXAM:
MR PROSTATE WITHOUT AND WITH CONTRAST
TECHNIQUE: Multiplanar multisequence MRI images were obtained of the pelvis
centered about the prostate. Pre and post contrast images were
obtained.
CONTRAST:  17mL MULTIHANCE GADOBENATE DIMEGLUMINE 529 MG/ML IV SOLN

[Series 4: T2 · coronal · 3.0mm · 0.56mm/px · 1 of 22 slices shown (1 of 5)]
[im 1/22]
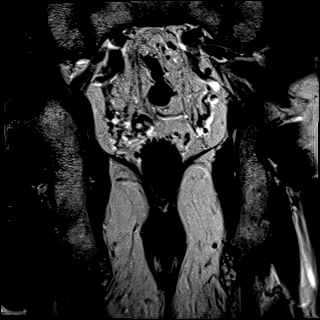

[Series 5: T1 · axial · 5.0mm · 1.25mm/px · 1 of 80 slices shown]
[im 1/80]
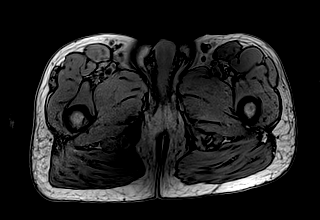

[Series 6: DWI · axial · 3.0mm · 1.75mm/px · 1 of 72 slices shown (1 of 3)]
[im 1/72]
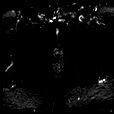

[Series 7: DWI · axial · 3.0mm · 1.75mm/px · 1 of 24 slices shown (2 of 3)]
[im 1/24]
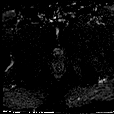

[Series 8: DWI · axial · 3.0mm · 1.75mm/px · 1 of 24 slices shown (3 of 3)]
[im 1/24]
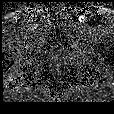

[Series 9: T2 · axial · 3.0mm · 0.56mm/px · 1 of 24 slices shown (2 of 5)]
[im 1/24]
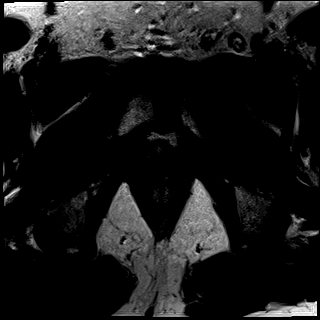

[Series 10: T2 · axial · 1.0mm · 1.04mm/px · z∈[+129,+200]mm · 2 of 72 slices shown (3 of 5)]
[im 1/72]
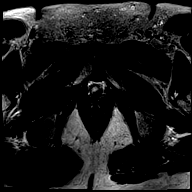
[im 72/72]
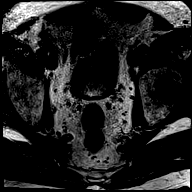

[Series 11: T2 · axial · 3.0mm · 0.56mm/px · 1 of 24 slices shown (4 of 5)]
[im 1/24]
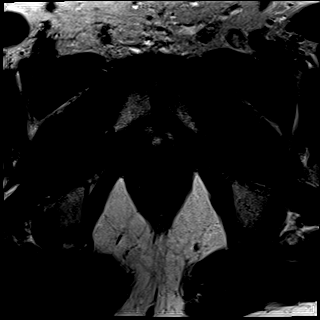

[Series 12: T2 · axial · 1.0mm · 1.04mm/px · z∈[+130,+201]mm · 2 of 72 slices shown (5 of 5)]
[im 1/72]
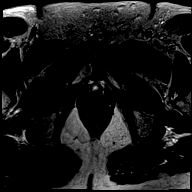
[im 72/72]
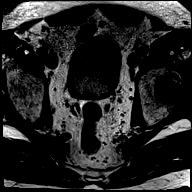

[Series 13: pre t1_twist_tra_dyn · axial · non-contrast · 3.5mm · 0.89mm/px · 1 of 20 slices shown]
[im 1/20]
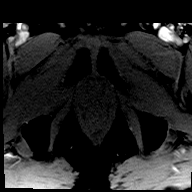

[Series 14: post t1_twist_tra_dyn-copy center · axial · non-contrast · 3.5mm · 0.89mm/px · z∈[+131,+197]mm · 16 of 600 slices shown]
[im 1/600]
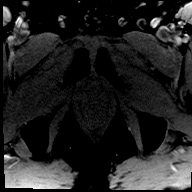
[im 40/600]
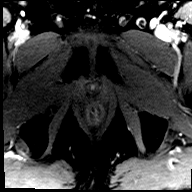
[im 80/600]
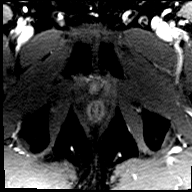
[im 120/600]
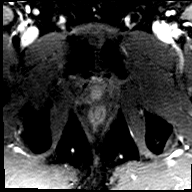
[im 160/600]
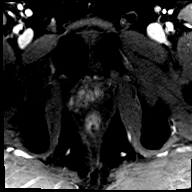
[im 200/600]
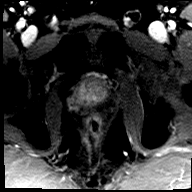
[im 240/600]
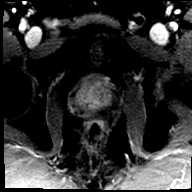
[im 280/600]
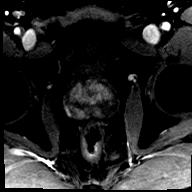
[im 320/600]
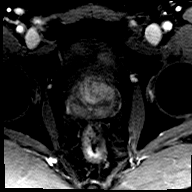
[im 360/600]
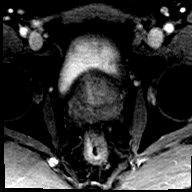
[im 400/600]
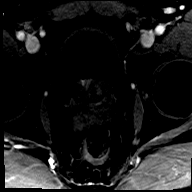
[im 440/600]
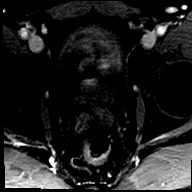
[im 480/600]
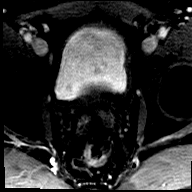
[im 520/600]
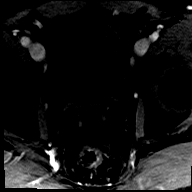
[im 560/600]
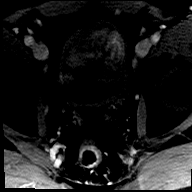
[im 600/600]
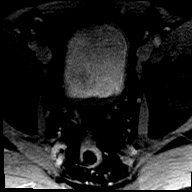

[Series 15: post t1_twist_tra_dyn-copy cent_sub · axial · 3.5mm · 0.89mm/px · z∈[+131,+197]mm · 16 of 580 slices shown]
[im 1/580]
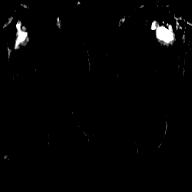
[im 39/580]
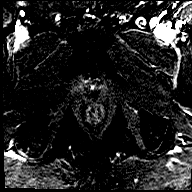
[im 78/580]
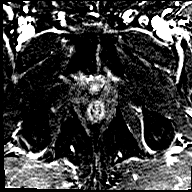
[im 116/580]
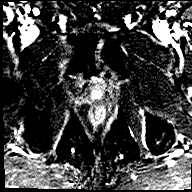
[im 155/580]
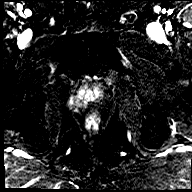
[im 194/580]
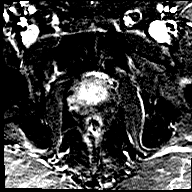
[im 232/580]
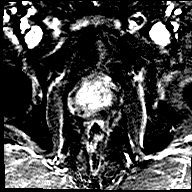
[im 271/580]
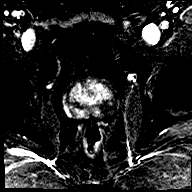
[im 309/580]
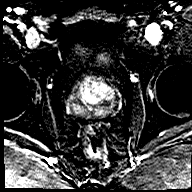
[im 348/580]
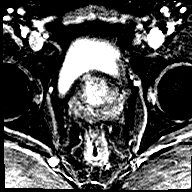
[im 387/580]
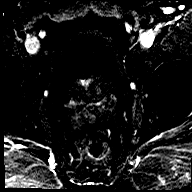
[im 425/580]
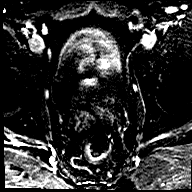
[im 464/580]
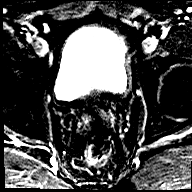
[im 502/580]
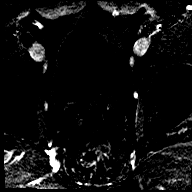
[im 541/580]
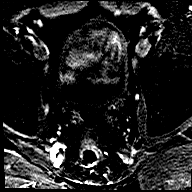
[im 580/580]
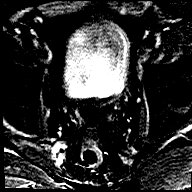

[Series 16: t1_vibe_dixon_tra_f · axial · 2.5mm · 0.91mm/px · z∈[+97,+294]mm · 2 of 80 slices shown]
[im 1/80]
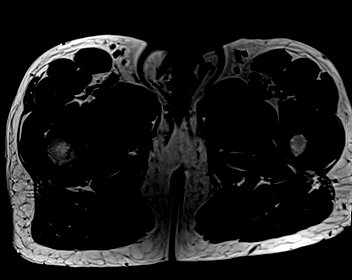
[im 80/80]
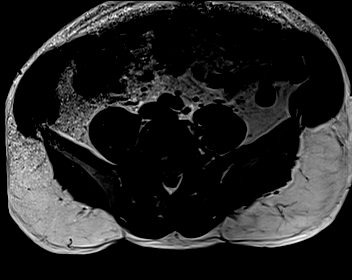

[Series 17: t1_vibe_dixon_tra_w · axial · 2.5mm · 0.91mm/px · z∈[+97,+294]mm · 2 of 80 slices shown]
[im 1/80]
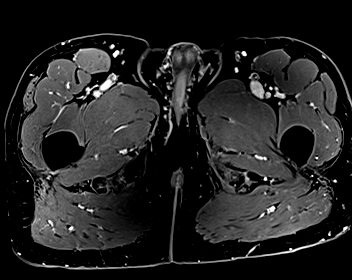
[im 80/80]
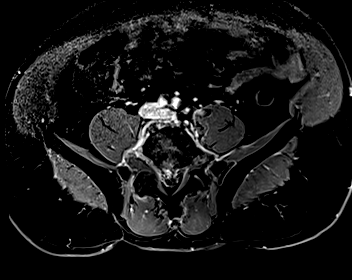

[48 of 48 positions shown; findings below may reference images not displayed]

FINDINGS: Mild motion degradation, most significant on the dedicated T2 axial
images. This is despite multiple repeats.

Prostate: Mild for age central gland enlargement and heterogeneity,
consistent with benign prostatic hyperplasia. No dominant central
gland nodule.

Within the lateral right peripheral zone at the level of the
midgland, an area of heterogeneous T2 hypointensity including at
cm on [DATE] and [DATE] corresponds to decreased signal on ADC map [DATE].
No convincing evidence of hyperintensity on long B value
diffusion-weighted imaging.

At the base, there is mild, nonspecific symmetric early
post-contrast enhancement throughout the peripheral zone including
on 170/15. More focal hyperenhancement within the correlate lateral
right peripheral zone midgland including on 154/15.

Volume: 4.3 x 4.2 x 3.7 cm (volume = 35 cm^3)

Transcapsular spread: No convincing evidence of transcapsular
spread, given motion degradation.

Seminal vesicle involvement: Absent

Neurovascular bundle involvement: Absent

Pelvic adenopathy: Absent

Bone metastasis: Absent

Other findings: No significant free fluid.  Normal urinary bladder.
IMPRESSION: 1. Multiparametric signal abnormality within the lateral right
peripheral zone, midgland level. PI-RADS(v2.1)-4.
2. Mild motion degradation. Given this limitation, no locally
advanced disease and no evidence of pelvic metastasis.

(I have post-processed this exam in the Dyna[employer] application for
potential fusion-guided biopsy.)

## 2022-12-29 DIAGNOSIS — R972 Elevated prostate specific antigen [PSA]: Secondary | ICD-10-CM

## 2022-12-29 DIAGNOSIS — D400 Neoplasm of uncertain behavior of prostate: Secondary | ICD-10-CM | POA: Diagnosis not present

## 2022-12-29 HISTORY — DX: Elevated prostate specific antigen (PSA): R97.20

## 2023-01-15 ENCOUNTER — Other Ambulatory Visit: Payer: Self-pay | Admitting: Urology

## 2023-01-15 DIAGNOSIS — R972 Elevated prostate specific antigen [PSA]: Secondary | ICD-10-CM

## 2023-01-15 DIAGNOSIS — D400 Neoplasm of uncertain behavior of prostate: Secondary | ICD-10-CM

## 2023-02-19 ENCOUNTER — Encounter: Payer: Self-pay | Admitting: Podiatry

## 2023-02-19 ENCOUNTER — Ambulatory Visit: Payer: Medicare HMO | Admitting: Podiatry

## 2023-02-19 DIAGNOSIS — B351 Tinea unguium: Secondary | ICD-10-CM

## 2023-02-19 DIAGNOSIS — E1169 Type 2 diabetes mellitus with other specified complication: Secondary | ICD-10-CM

## 2023-02-19 DIAGNOSIS — M79674 Pain in right toe(s): Secondary | ICD-10-CM | POA: Diagnosis not present

## 2023-02-19 DIAGNOSIS — Q828 Other specified congenital malformations of skin: Secondary | ICD-10-CM | POA: Diagnosis not present

## 2023-02-19 DIAGNOSIS — M79675 Pain in left toe(s): Secondary | ICD-10-CM | POA: Diagnosis not present

## 2023-02-19 NOTE — Progress Notes (Signed)
This patient returns to my office for at risk foot care.  This patient requires this care by a professional since this patient will be at risk due to having diabetes mellitus.    This patient is unable to cut nails himself since the patient cannot reach his nails.These nails are painful walking and wearing shoes.   Patient has painful callus under the forefoot  both feet.  The callus is painful walking and wearing his shoes. Patient is accompanied by male caregiver.This patient presents for at risk foot care today.  General Appearance  Alert, conversant and in no acute stress.  Vascular  Dorsalis pedis and posterior tibial  pulses are palpable  bilaterally.  Capillary return is within normal limits  bilaterally. Temperature is within normal limits  bilaterally.  Neurologic  Senn-Weinstein monofilament wire test within normal limits  bilaterally. Muscle power within normal limits bilaterally.  Nails Thick disfigured discolored nails with subungual debris  from hallux to fifth toes bilaterally. No evidence of bacterial infection or drainage bilaterally.  Orthopedic  No limitations of motion  feet .  No crepitus or effusions noted.  No bony pathology or digital deformities noted.  Skin  normotropic skin noted bilaterally.  No signs of infections or ulcers noted.   Porokeratotic lesions sub 1/4 left foot and sub 1  right foot. Corn sub 5th toe left foot.  Porokeratosis  B/L  Consent was obtained for treatment procedures.  Debride callus with # 15 blade. Nails performed as a courtesy. Filed with dremel without incident.    Return office visit   10 months                 Told patient to return for periodic foot care and evaluation due to potential at risk complications.   Arlett Goold DPM   

## 2023-02-21 ENCOUNTER — Inpatient Hospital Stay: Admission: RE | Admit: 2023-02-21 | Payer: Medicare HMO | Source: Ambulatory Visit

## 2023-03-05 DIAGNOSIS — E039 Hypothyroidism, unspecified: Secondary | ICD-10-CM | POA: Diagnosis not present

## 2023-03-05 DIAGNOSIS — E559 Vitamin D deficiency, unspecified: Secondary | ICD-10-CM | POA: Diagnosis not present

## 2023-03-05 DIAGNOSIS — E782 Mixed hyperlipidemia: Secondary | ICD-10-CM | POA: Diagnosis not present

## 2023-03-05 DIAGNOSIS — E1169 Type 2 diabetes mellitus with other specified complication: Secondary | ICD-10-CM | POA: Diagnosis not present

## 2023-03-09 DIAGNOSIS — F4310 Post-traumatic stress disorder, unspecified: Secondary | ICD-10-CM | POA: Diagnosis not present

## 2023-03-09 DIAGNOSIS — E559 Vitamin D deficiency, unspecified: Secondary | ICD-10-CM | POA: Diagnosis not present

## 2023-03-09 DIAGNOSIS — K219 Gastro-esophageal reflux disease without esophagitis: Secondary | ICD-10-CM | POA: Diagnosis not present

## 2023-03-09 DIAGNOSIS — N401 Enlarged prostate with lower urinary tract symptoms: Secondary | ICD-10-CM | POA: Diagnosis not present

## 2023-03-09 DIAGNOSIS — E1169 Type 2 diabetes mellitus with other specified complication: Secondary | ICD-10-CM | POA: Diagnosis not present

## 2023-03-09 DIAGNOSIS — F411 Generalized anxiety disorder: Secondary | ICD-10-CM | POA: Diagnosis not present

## 2023-03-09 DIAGNOSIS — I10 Essential (primary) hypertension: Secondary | ICD-10-CM | POA: Diagnosis not present

## 2023-03-09 DIAGNOSIS — E039 Hypothyroidism, unspecified: Secondary | ICD-10-CM | POA: Diagnosis not present

## 2023-03-09 DIAGNOSIS — E782 Mixed hyperlipidemia: Secondary | ICD-10-CM | POA: Diagnosis not present

## 2023-03-19 ENCOUNTER — Ambulatory Visit
Admission: RE | Admit: 2023-03-19 | Discharge: 2023-03-19 | Disposition: A | Discharge status: Home / Self Care | Payer: Medicare HMO | Source: Ambulatory Visit | Attending: Urology | Admitting: Urology

## 2023-03-19 DIAGNOSIS — D400 Neoplasm of uncertain behavior of prostate: Secondary | ICD-10-CM

## 2023-03-19 DIAGNOSIS — R972 Elevated prostate specific antigen [PSA]: Secondary | ICD-10-CM

## 2023-03-19 MED ORDER — GADOPICLENOL 0.5 MMOL/ML IV SOLN
9.0000 mL | Freq: Once | INTRAVENOUS | Status: AC | PRN
  Administered 2023-03-19: 9 mL via INTRAVENOUS

## 2023-03-20 DIAGNOSIS — D224 Melanocytic nevi of scalp and neck: Secondary | ICD-10-CM | POA: Diagnosis not present

## 2023-03-29 DIAGNOSIS — C61 Malignant neoplasm of prostate: Secondary | ICD-10-CM | POA: Diagnosis not present

## 2023-03-29 DIAGNOSIS — R972 Elevated prostate specific antigen [PSA]: Secondary | ICD-10-CM | POA: Diagnosis not present

## 2023-04-12 ENCOUNTER — Telehealth: Payer: Self-pay | Admitting: Radiation Oncology

## 2023-04-12 DIAGNOSIS — C61 Malignant neoplasm of prostate: Secondary | ICD-10-CM | POA: Diagnosis not present

## 2023-04-12 NOTE — Telephone Encounter (Signed)
5/9 @ 3:25 pm called and left voicemail to [place] ([place]), due to patient GS 4+3, PET or CT/Bone is needed for staging.  No reports or images done and/or sent with referral.  Waiting on call back, if patient is already or will be schedule for PET as recommended by Dr. Alvester Morin.

## 2023-04-16 ENCOUNTER — Telehealth: Payer: Self-pay | Admitting: Radiation Oncology

## 2023-04-16 NOTE — Telephone Encounter (Signed)
5/13/ @ 8:58 am F/U call to [facility]) need PET scan before schedule patient due to GS.  Orders are in but not yet scheduled; will call back once scheduled -per Justin ([place]).

## 2023-04-23 ENCOUNTER — Other Ambulatory Visit (HOSPITAL_COMMUNITY): Payer: Self-pay | Admitting: Urology

## 2023-04-23 DIAGNOSIS — C61 Malignant neoplasm of prostate: Secondary | ICD-10-CM

## 2023-05-01 ENCOUNTER — Telehealth: Payer: Self-pay | Admitting: Radiation Oncology

## 2023-05-01 NOTE — Telephone Encounter (Signed)
5/28 @ 8:16 am Left voicemail for on both patient's contact numbers for patient to call our office to be schedule for consult with Dr. Kathrynn Running.

## 2023-05-07 ENCOUNTER — Encounter: Payer: Self-pay | Admitting: Podiatry

## 2023-05-07 ENCOUNTER — Ambulatory Visit (INDEPENDENT_AMBULATORY_CARE_PROVIDER_SITE_OTHER): Payer: Medicare HMO | Admitting: Podiatry

## 2023-05-07 DIAGNOSIS — B351 Tinea unguium: Secondary | ICD-10-CM

## 2023-05-07 DIAGNOSIS — Q828 Other specified congenital malformations of skin: Secondary | ICD-10-CM

## 2023-05-07 DIAGNOSIS — M79675 Pain in left toe(s): Secondary | ICD-10-CM

## 2023-05-07 DIAGNOSIS — E1169 Type 2 diabetes mellitus with other specified complication: Secondary | ICD-10-CM

## 2023-05-07 DIAGNOSIS — M79674 Pain in right toe(s): Secondary | ICD-10-CM

## 2023-05-07 NOTE — Progress Notes (Signed)
This patient returns to my office for at risk foot care.  This patient requires this care by a professional since this patient will be at risk due to having diabetes mellitus.    This patient is unable to cut nails himself since the patient cannot reach his nails.These nails are painful walking and wearing shoes.   Patient has painful callus under the forefoot  both feet.  The callus is painful walking and wearing his shoes. Patient is accompanied by male caregiver.This patient presents for at risk foot care today.  General Appearance  Alert, conversant and in no acute stress.  Vascular  Dorsalis pedis and posterior tibial  pulses are palpable  bilaterally.  Capillary return is within normal limits  bilaterally. Temperature is within normal limits  bilaterally.  Neurologic  Senn-Weinstein monofilament wire test within normal limits  bilaterally. Muscle power within normal limits bilaterally.  Nails Thick disfigured discolored nails with subungual debris  from hallux to fifth toes bilaterally. No evidence of bacterial infection or drainage bilaterally.  Orthopedic  No limitations of motion  feet .  No crepitus or effusions noted.  No bony pathology or digital deformities noted.  Skin  normotropic skin noted bilaterally.  No signs of infections or ulcers noted.   Porokeratotic lesions sub 1/4 B/L.  Onychomycosis  Porokeratosis  B/L.  Consent was obtained for treatment procedures.  Debride callus with # 15 blade. Nails performed as a courtesy. Filed with dremel without incident.    Return office visit   10 weeks                 Told patient to return for periodic foot care and evaluation due to potential at risk complications.   Helane Gunther DPM

## 2023-05-10 ENCOUNTER — Encounter: Payer: Self-pay | Admitting: Radiation Oncology

## 2023-05-10 NOTE — Progress Notes (Incomplete)
GU Location of Tumor / Histology: Prostate Ca  If Prostate Cancer, Gleason Score is (4 + 3) and PSA is (12.3 on 12/22/2022)  Biopsies       03/19/2023 Dr. Modena Slater MR Prostate with/without Contrast CLINICAL DATA: Elevated PSA level of 12.30. R97.20   IMPRESSION: 1. Stable PI-RADS category 4 lesion of the right posterolateral peripheral zone. Appearance stable from 04/12/2022. Targeting data sent to [facility]. 2. Sigmoid colon diverticulosis.   Past/Anticipated interventions by urology, if any: NA  Past/Anticipated interventions by medical oncology, if any: NA  Weight changes, if any: {:18581}  IPSS: SHIM  Bowel/Bladder complaints, if any: {:18581}   Nausea/Vomiting, if any: {:18581}  Pain issues, if any:  {:18581}  SAFETY ISSUES: Prior radiation? {:18581} Pacemaker/ICD? {:18581} Possible current pregnancy? Male Is the patient on methotrexate? No  Current Complaints / other details:  ***

## 2023-05-15 ENCOUNTER — Encounter (HOSPITAL_COMMUNITY)
Admission: RE | Admit: 2023-05-15 | Discharge: 2023-05-15 | Disposition: A | Discharge status: Home / Self Care | Payer: Medicare HMO | Source: Ambulatory Visit | Attending: Urology | Admitting: Urology

## 2023-05-15 DIAGNOSIS — C61 Malignant neoplasm of prostate: Secondary | ICD-10-CM | POA: Insufficient documentation

## 2023-05-15 MED ORDER — PIFLIFOLASTAT F 18 (PYLARIFY) INJECTION
9.0000 | Freq: Once | INTRAVENOUS | Status: AC
  Administered 2023-05-15: 9.77 via INTRAVENOUS

## 2023-05-17 ENCOUNTER — Encounter: Payer: Self-pay | Admitting: Urology

## 2023-05-17 ENCOUNTER — Ambulatory Visit
Admission: RE | Admit: 2023-05-17 | Discharge: 2023-05-17 | Disposition: A | Discharge status: Home / Self Care | Payer: Medicare HMO | Source: Ambulatory Visit | Attending: Radiation Oncology | Admitting: Radiation Oncology

## 2023-05-17 ENCOUNTER — Encounter: Payer: Self-pay | Admitting: Radiation Oncology

## 2023-05-17 ENCOUNTER — Other Ambulatory Visit: Payer: Self-pay

## 2023-05-17 ENCOUNTER — Other Ambulatory Visit: Payer: Self-pay | Admitting: Urology

## 2023-05-17 VITALS — BP 149/73 | HR 73 | Temp 98.2°F | Resp 18 | Ht 70.0 in | Wt 176.0 lb

## 2023-05-17 DIAGNOSIS — I10 Essential (primary) hypertension: Secondary | ICD-10-CM | POA: Diagnosis not present

## 2023-05-17 DIAGNOSIS — K219 Gastro-esophageal reflux disease without esophagitis: Secondary | ICD-10-CM | POA: Diagnosis not present

## 2023-05-17 DIAGNOSIS — M129 Arthropathy, unspecified: Secondary | ICD-10-CM | POA: Diagnosis not present

## 2023-05-17 DIAGNOSIS — Z79899 Other long term (current) drug therapy: Secondary | ICD-10-CM | POA: Insufficient documentation

## 2023-05-17 DIAGNOSIS — Z87891 Personal history of nicotine dependence: Secondary | ICD-10-CM | POA: Insufficient documentation

## 2023-05-17 DIAGNOSIS — Z7984 Long term (current) use of oral hypoglycemic drugs: Secondary | ICD-10-CM | POA: Insufficient documentation

## 2023-05-17 DIAGNOSIS — Z191 Hormone sensitive malignancy status: Secondary | ICD-10-CM | POA: Diagnosis not present

## 2023-05-17 DIAGNOSIS — C61 Malignant neoplasm of prostate: Secondary | ICD-10-CM

## 2023-05-17 DIAGNOSIS — E119 Type 2 diabetes mellitus without complications: Secondary | ICD-10-CM | POA: Diagnosis not present

## 2023-05-17 DIAGNOSIS — E079 Disorder of thyroid, unspecified: Secondary | ICD-10-CM | POA: Insufficient documentation

## 2023-05-17 DIAGNOSIS — Z79890 Hormone replacement therapy (postmenopausal): Secondary | ICD-10-CM | POA: Insufficient documentation

## 2023-05-17 DIAGNOSIS — E7800 Pure hypercholesterolemia, unspecified: Secondary | ICD-10-CM | POA: Insufficient documentation

## 2023-05-17 HISTORY — DX: Type 2 diabetes mellitus without complications: E11.9

## 2023-05-17 HISTORY — DX: Benign prostatic hyperplasia with lower urinary tract symptoms: N13.8

## 2023-05-17 HISTORY — DX: Unspecified osteoarthritis, unspecified site: M19.90

## 2023-05-17 HISTORY — DX: Gastro-esophageal reflux disease without esophagitis: K21.9

## 2023-05-17 HISTORY — DX: Other obstructive and reflux uropathy: N40.1

## 2023-05-17 NOTE — Progress Notes (Signed)
Introduced myself to the patient, and his care giver Lupita Leash, as the prostate nurse navigator.  No barriers to care identified at this time.  He is here to discuss his radiation treatment options, and plans to proceed with brachytherapy.  I gave Lupita Leash my business card and asked them to call me with questions or concerns. Verbalized understanding.
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
## 2023-05-18 ENCOUNTER — Telehealth: Payer: Self-pay | Admitting: *Deleted

## 2023-05-18 NOTE — Telephone Encounter (Signed)
CALLED PATIENT TO UPDATE, SPOKE WITH PATIENT'S FRIEND DONNA COLTRANE AND SHE IS AWARE OF PRE-SEED APPTS. AND IMPLANT DATE

## 2023-05-25 ENCOUNTER — Telehealth: Payer: Self-pay | Admitting: *Deleted

## 2023-05-25 NOTE — Telephone Encounter (Signed)
CALLED PATIENT'S CARE TAKER TO GIVE PRE-SEED APPTS. AND IMPLANT DATE, LVM FOR A RETURN CALL

## 2023-05-27 DIAGNOSIS — C61 Malignant neoplasm of prostate: Secondary | ICD-10-CM | POA: Diagnosis not present

## 2023-05-27 DIAGNOSIS — Z191 Hormone sensitive malignancy status: Secondary | ICD-10-CM | POA: Diagnosis not present

## 2023-06-01 DIAGNOSIS — C61 Malignant neoplasm of prostate: Secondary | ICD-10-CM | POA: Diagnosis not present

## 2023-06-06 ENCOUNTER — Encounter: Payer: Self-pay | Admitting: Urology

## 2023-06-06 NOTE — Progress Notes (Signed)
Pre-seed nursing interview for a diagnosis of Malignant neoplasm of prostate (HCC).  Patient identity verified x2. Patient reports doing well. No issues conveyed at this time.  Meaningful use complete.  Urinary Management medication(s)- Tamsulosin Urology appointment date- 07/17/23, with Dr. Alvester Morin at [facility]  Ht 5\' 10"  (1.778 m)   Wt 180 lb (81.6 kg)   BMI 25.83 kg/m   This concludes the interaction.  Ruel Favors, LPN

## 2023-06-12 ENCOUNTER — Telehealth: Payer: Self-pay | Admitting: *Deleted

## 2023-06-12 NOTE — Telephone Encounter (Signed)
CALLED PATIENT TO REMIND OF PRE-SEED APPTS. FOR 06-14-23, LVM FOR A RETURN CALL

## 2023-06-14 ENCOUNTER — Ambulatory Visit
Admission: RE | Admit: 2023-06-14 | Discharge: 2023-06-14 | Disposition: A | Discharge status: Home / Self Care | Payer: Medicare HMO | Source: Ambulatory Visit | Attending: Radiation Oncology | Admitting: Radiation Oncology

## 2023-06-14 ENCOUNTER — Ambulatory Visit
Admission: RE | Admit: 2023-06-14 | Discharge: 2023-06-14 | Disposition: A | Discharge status: Home / Self Care | Payer: Medicare HMO | Source: Ambulatory Visit | Attending: Urology | Admitting: Urology

## 2023-06-14 ENCOUNTER — Encounter (HOSPITAL_COMMUNITY)
Admission: RE | Admit: 2023-06-14 | Discharge: 2023-06-14 | Disposition: A | Discharge status: Home / Self Care | Payer: Medicare HMO | Source: Ambulatory Visit

## 2023-06-14 ENCOUNTER — Other Ambulatory Visit: Payer: Self-pay

## 2023-06-14 ENCOUNTER — Encounter: Payer: Self-pay | Admitting: Urology

## 2023-06-14 VITALS — Ht 70.0 in | Wt 175.0 lb

## 2023-06-14 DIAGNOSIS — C61 Malignant neoplasm of prostate: Secondary | ICD-10-CM

## 2023-06-14 DIAGNOSIS — Z01810 Encounter for preprocedural cardiovascular examination: Secondary | ICD-10-CM | POA: Insufficient documentation

## 2023-06-14 DIAGNOSIS — R001 Bradycardia, unspecified: Secondary | ICD-10-CM | POA: Insufficient documentation

## 2023-06-14 DIAGNOSIS — Z01818 Encounter for other preprocedural examination: Secondary | ICD-10-CM

## 2023-06-14 DIAGNOSIS — Z191 Hormone sensitive malignancy status: Secondary | ICD-10-CM | POA: Diagnosis not present
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
## 2023-06-14 NOTE — Progress Notes (Signed)
  Radiation Oncology         [phone]) [phone] ________________________________  Name: Melvin Warner MRN: [id number]  Date: 06/14/2023  DOB: Jun 01, 1951  SIMULATION AND TREATMENT PLANNING NOTE PUBIC ARCH STUDY  DG:UYQI, Kathleene Hazel, MD  Benita Stabile, MD  DIAGNOSIS: 72 y.o. gentleman with Stage T1c adenocarcinoma of the prostate with Gleason score of 4+3, and PSA of 12.30.   Oncology History  Malignant neoplasm of prostate (HCC)  03/29/2023 Cancer Staging   Staging form: Prostate, AJCC 8th Edition - Clinical stage from 03/29/2023: Stage IIC (cT1c, cN0, cM0, PSA: 12.3, Grade Group: 3) - Signed by Marcello Fennel, PA-C on 05/17/2023 Histopathologic type: Adenocarcinoma, NOS Stage prefix: Initial diagnosis Prostate specific antigen (PSA) range: 10 to 19 Gleason primary pattern: 4 Gleason secondary pattern: 3 Gleason score: 7 Histologic grading system: 5 grade system Number of biopsy cores examined: 16 Number of biopsy cores positive: 10 Location of positive needle core biopsies: One side   05/17/2023 Initial Diagnosis   Malignant neoplasm of prostate (HCC)       ICD-10-CM   1. Malignant neoplasm of prostate (HCC)  C61       COMPLEX SIMULATION:  The patient presented today for evaluation for possible prostate seed implant. He was brought to the radiation planning suite and placed supine on the CT couch. A 3-dimensional image study set was obtained in upload to the planning computer. There, on each axial slice, I contoured the prostate gland. Then, using three-dimensional radiation planning tools I reconstructed the prostate in view of the structures from the transperineal needle pathway to assess for possible pubic arch interference. In doing so, I did not appreciate any pubic arch interference. Also, the patient's prostate volume was estimated based on the drawn structure. The volume was 36 cc.  Given the pubic arch appearance and prostate volume, patient remains a good candidate to proceed with  prostate seed implant. Today, he freely provided informed written consent to proceed.    PLAN: The patient will undergo prostate seed implant.   ________________________________  Artist Pais. Kathrynn Running, M.D.

## 2023-06-15 DIAGNOSIS — E782 Mixed hyperlipidemia: Secondary | ICD-10-CM | POA: Diagnosis not present

## 2023-06-15 DIAGNOSIS — E039 Hypothyroidism, unspecified: Secondary | ICD-10-CM | POA: Diagnosis not present

## 2023-06-15 DIAGNOSIS — E1169 Type 2 diabetes mellitus with other specified complication: Secondary | ICD-10-CM | POA: Diagnosis not present

## 2023-06-15 DIAGNOSIS — E559 Vitamin D deficiency, unspecified: Secondary | ICD-10-CM | POA: Diagnosis not present

## 2023-06-21 DIAGNOSIS — I10 Essential (primary) hypertension: Secondary | ICD-10-CM | POA: Diagnosis not present

## 2023-06-21 DIAGNOSIS — E039 Hypothyroidism, unspecified: Secondary | ICD-10-CM | POA: Diagnosis not present

## 2023-06-21 DIAGNOSIS — C61 Malignant neoplasm of prostate: Secondary | ICD-10-CM | POA: Diagnosis not present

## 2023-06-21 DIAGNOSIS — E1169 Type 2 diabetes mellitus with other specified complication: Secondary | ICD-10-CM | POA: Diagnosis not present

## 2023-06-21 DIAGNOSIS — E559 Vitamin D deficiency, unspecified: Secondary | ICD-10-CM | POA: Diagnosis not present

## 2023-06-21 DIAGNOSIS — N401 Enlarged prostate with lower urinary tract symptoms: Secondary | ICD-10-CM | POA: Diagnosis not present

## 2023-06-21 DIAGNOSIS — E782 Mixed hyperlipidemia: Secondary | ICD-10-CM | POA: Diagnosis not present

## 2023-06-21 DIAGNOSIS — E1165 Type 2 diabetes mellitus with hyperglycemia: Secondary | ICD-10-CM | POA: Diagnosis not present

## 2023-06-21 DIAGNOSIS — F411 Generalized anxiety disorder: Secondary | ICD-10-CM | POA: Diagnosis not present

## 2023-07-17 DIAGNOSIS — C61 Malignant neoplasm of prostate: Secondary | ICD-10-CM | POA: Diagnosis not present

## 2023-07-27 ENCOUNTER — Encounter (HOSPITAL_BASED_OUTPATIENT_CLINIC_OR_DEPARTMENT_OTHER): Payer: Self-pay | Admitting: Urology

## 2023-07-31 ENCOUNTER — Other Ambulatory Visit: Payer: Self-pay

## 2023-07-31 ENCOUNTER — Encounter (HOSPITAL_BASED_OUTPATIENT_CLINIC_OR_DEPARTMENT_OTHER): Payer: Self-pay | Admitting: Urology

## 2023-07-31 NOTE — Progress Notes (Addendum)
Spoke w/ via phone for pre-op interview---poa Melvin Warner (pt cannot read or write), pt can sign name Lab needs dos----  I stat             Lab results------EKG 06-14-2023 epic COVID test -----patient states asymptomatic no test needed Arrive at -------1000 am 08-10-2023 NPO after MN NO Solid Food.  Clear liquids from MN until---900 am Med rec completed Medications to take morning of surgery ----xanax, levothyroxine- Diabetic medication -----n/a Patient instructed no nail polish to be worn day of surgery Patient instructed to bring photo id and insurance card day of surgery Patient aware to have Driver (ride ) / caregiver    for 24 hours after surgery  Melvin Warner friend/poa Patient Special Instructions -----poa Melvin Warner to call dr bell office, pt cannot do enema and had to do alternative for fleets enema for prostate biopsy in past Pre-Op special Instructions -----none Patient verbalized understanding of instructions that were given at this phone interview. Patient denies shortness of breath, chest pain, fever, cough at this phone interview.  [name] to bring copy of poa day of surgery.

## 2023-08-01 ENCOUNTER — Telehealth: Payer: Self-pay | Admitting: *Deleted

## 2023-08-01 NOTE — Telephone Encounter (Signed)
CALLED PATIENT TO REMIND OF PROCEDURE FOR 08-10-23, LVM  FOR A RETURN CALL

## 2023-08-10 ENCOUNTER — Other Ambulatory Visit: Payer: Self-pay

## 2023-08-10 ENCOUNTER — Ambulatory Visit (INDEPENDENT_AMBULATORY_CARE_PROVIDER_SITE_OTHER): Payer: Medicare HMO | Admitting: Podiatry

## 2023-08-10 ENCOUNTER — Encounter (HOSPITAL_BASED_OUTPATIENT_CLINIC_OR_DEPARTMENT_OTHER): Payer: Self-pay | Admitting: Urology

## 2023-08-10 ENCOUNTER — Ambulatory Visit (HOSPITAL_BASED_OUTPATIENT_CLINIC_OR_DEPARTMENT_OTHER)
Admission: RE | Admit: 2023-08-10 | Discharge: 2023-08-10 | Disposition: A | Discharge status: Home / Self Care | Payer: Medicare HMO | Source: Ambulatory Visit | Attending: Urology | Admitting: Urology

## 2023-08-10 ENCOUNTER — Encounter (HOSPITAL_BASED_OUTPATIENT_CLINIC_OR_DEPARTMENT_OTHER)
Admission: RE | Disposition: A | Discharge status: Home / Self Care | Payer: Self-pay | Source: Ambulatory Visit | Attending: Urology

## 2023-08-10 ENCOUNTER — Ambulatory Visit (HOSPITAL_BASED_OUTPATIENT_CLINIC_OR_DEPARTMENT_OTHER): Payer: Medicare HMO | Admitting: Anesthesiology

## 2023-08-10 ENCOUNTER — Encounter: Payer: Self-pay | Admitting: Podiatry

## 2023-08-10 ENCOUNTER — Ambulatory Visit (HOSPITAL_COMMUNITY): Payer: Medicare HMO

## 2023-08-10 DIAGNOSIS — I10 Essential (primary) hypertension: Secondary | ICD-10-CM | POA: Insufficient documentation

## 2023-08-10 DIAGNOSIS — Z79899 Other long term (current) drug therapy: Secondary | ICD-10-CM | POA: Insufficient documentation

## 2023-08-10 DIAGNOSIS — E1169 Type 2 diabetes mellitus with other specified complication: Secondary | ICD-10-CM

## 2023-08-10 DIAGNOSIS — M79674 Pain in right toe(s): Secondary | ICD-10-CM

## 2023-08-10 DIAGNOSIS — E039 Hypothyroidism, unspecified: Secondary | ICD-10-CM | POA: Diagnosis not present

## 2023-08-10 DIAGNOSIS — Z191 Hormone sensitive malignancy status: Secondary | ICD-10-CM | POA: Diagnosis not present

## 2023-08-10 DIAGNOSIS — C61 Malignant neoplasm of prostate: Secondary | ICD-10-CM | POA: Insufficient documentation

## 2023-08-10 DIAGNOSIS — Z87891 Personal history of nicotine dependence: Secondary | ICD-10-CM | POA: Diagnosis not present

## 2023-08-10 DIAGNOSIS — B351 Tinea unguium: Secondary | ICD-10-CM | POA: Diagnosis not present

## 2023-08-10 DIAGNOSIS — E119 Type 2 diabetes mellitus without complications: Secondary | ICD-10-CM

## 2023-08-10 DIAGNOSIS — Z01818 Encounter for other preprocedural examination: Secondary | ICD-10-CM

## 2023-08-10 DIAGNOSIS — Q828 Other specified congenital malformations of skin: Secondary | ICD-10-CM

## 2023-08-10 DIAGNOSIS — M79675 Pain in left toe(s): Secondary | ICD-10-CM

## 2023-08-10 HISTORY — DX: Illiteracy and low-level literacy: Z55.0

## 2023-08-10 HISTORY — DX: Malignant neoplasm of prostate: C61

## 2023-08-10 HISTORY — PX: SPACE OAR INSTILLATION: SHX6769

## 2023-08-10 HISTORY — PX: RADIOACTIVE SEED IMPLANT: SHX5150

## 2023-08-10 LAB — POCT I-STAT, CHEM 8
BUN: 24 mg/dL — ABNORMAL HIGH (ref 8–23)
Calcium, Ion: 1.14 mmol/L — ABNORMAL LOW (ref 1.15–1.40)
Chloride: 105 mmol/L (ref 98–111)
Creatinine, Ser: 0.9 mg/dL (ref 0.61–1.24)
Glucose, Bld: 114 mg/dL — ABNORMAL HIGH (ref 70–99)
HCT: 43 % (ref 39.0–52.0)
Hemoglobin: 14.6 g/dL (ref 13.0–17.0)
Potassium: 3.6 mmol/L (ref 3.5–5.1)
Sodium: 142 mmol/L (ref 135–145)
TCO2: 25 mmol/L (ref 22–32)

## 2023-08-10 LAB — GLUCOSE, CAPILLARY: Glucose-Capillary: 140 mg/dL — ABNORMAL HIGH (ref 70–99)

## 2023-08-10 SURGERY — INSERTION, RADIATION SOURCE, PROSTATE
Anesthesia: General

## 2023-08-10 MED ORDER — ACETAMINOPHEN 160 MG/5ML PO SOLN: 325.0000 mg | ORAL | Status: DC | PRN

## 2023-08-10 MED ORDER — EPHEDRINE SULFATE (PRESSORS) 50 MG/ML IJ SOLN
INTRAMUSCULAR | Status: DC | PRN
  Administered 2023-08-10: 5 mg via INTRAVENOUS
  Administered 2023-08-10: 10 mg via INTRAVENOUS
  Administered 2023-08-10: 5 mg via INTRAVENOUS

## 2023-08-10 MED ORDER — OXYCODONE HCL 5 MG PO TABS: 5.0000 mg | ORAL_TABLET | Freq: Once | ORAL | Status: DC | PRN

## 2023-08-10 MED ORDER — ONDANSETRON HCL 4 MG/2ML IJ SOLN
INTRAMUSCULAR | Status: DC | PRN
  Administered 2023-08-10: 4 mg via INTRAVENOUS

## 2023-08-10 MED ORDER — SODIUM CHLORIDE (PF) 0.9 % IJ SOLN
INTRAMUSCULAR | Status: DC | PRN
  Administered 2023-08-10: 10 mL via INTRAVENOUS

## 2023-08-10 MED ORDER — CIPROFLOXACIN IN D5W 400 MG/200ML IV SOLN
INTRAVENOUS | Status: AC
  Filled 2023-08-10: qty 200

## 2023-08-10 MED ORDER — OXYCODONE HCL 5 MG/5ML PO SOLN: 5.0000 mg | Freq: Once | ORAL | Status: DC | PRN

## 2023-08-10 MED ORDER — SUGAMMADEX SODIUM 200 MG/2ML IV SOLN
INTRAVENOUS | Status: DC | PRN
  Administered 2023-08-10: 200 mg via INTRAVENOUS

## 2023-08-10 MED ORDER — ACETAMINOPHEN 10 MG/ML IV SOLN: 1000.0000 mg | Freq: Once | INTRAVENOUS | Status: DC | PRN

## 2023-08-10 MED ORDER — ACETAMINOPHEN 325 MG PO TABS: 325.0000 mg | ORAL_TABLET | ORAL | Status: DC | PRN

## 2023-08-10 MED ORDER — DEXAMETHASONE SODIUM PHOSPHATE 10 MG/ML IJ SOLN
INTRAMUSCULAR | Status: DC | PRN
  Administered 2023-08-10: 5 mg via INTRAVENOUS

## 2023-08-10 MED ORDER — EPHEDRINE 5 MG/ML INJ
INTRAVENOUS | Status: AC
  Filled 2023-08-10: qty 5

## 2023-08-10 MED ORDER — ONDANSETRON HCL 4 MG/2ML IJ SOLN
INTRAMUSCULAR | Status: AC
  Filled 2023-08-10: qty 2

## 2023-08-10 MED ORDER — FLEET ENEMA RE ENEM: 1.0000 | ENEMA | Freq: Once | RECTAL | Status: DC

## 2023-08-10 MED ORDER — ROCURONIUM BROMIDE 10 MG/ML (PF) SYRINGE
PREFILLED_SYRINGE | INTRAVENOUS | Status: DC | PRN
  Administered 2023-08-10: 60 mg via INTRAVENOUS

## 2023-08-10 MED ORDER — LACTATED RINGERS IV SOLN: INTRAVENOUS | Status: DC

## 2023-08-10 MED ORDER — GLYCOPYRROLATE PF 0.2 MG/ML IJ SOSY
PREFILLED_SYRINGE | INTRAMUSCULAR | Status: DC | PRN
Start: 2023-08-10 — End: 2023-08-10
  Administered 2023-08-10: .2 mg via INTRAVENOUS

## 2023-08-10 MED ORDER — STERILE WATER FOR IRRIGATION IR SOLN
Status: DC | PRN
  Administered 2023-08-10: 500 mL

## 2023-08-10 MED ORDER — PROPOFOL 10 MG/ML IV BOLUS
INTRAVENOUS | Status: DC | PRN
  Administered 2023-08-10: 150 mg via INTRAVENOUS
  Administered 2023-08-10: 20 mg via INTRAVENOUS

## 2023-08-10 MED ORDER — PROPOFOL 10 MG/ML IV BOLUS
INTRAVENOUS | Status: AC
  Filled 2023-08-10: qty 20

## 2023-08-10 MED ORDER — CIPROFLOXACIN IN D5W 400 MG/200ML IV SOLN
400.0000 mg | INTRAVENOUS | Status: AC
  Administered 2023-08-10: 400 mg via INTRAVENOUS

## 2023-08-10 MED ORDER — HYDROCODONE-ACETAMINOPHEN 5-325 MG PO TABS
1.0000 | ORAL_TABLET | ORAL | 0 refills | Status: AC | PRN
Start: 2023-08-10 — End: 2024-08-09

## 2023-08-10 MED ORDER — LIDOCAINE 2% (20 MG/ML) 5 ML SYRINGE
INTRAMUSCULAR | Status: DC | PRN
  Administered 2023-08-10: 100 mg via INTRAVENOUS

## 2023-08-10 MED ORDER — FENTANYL CITRATE (PF) 100 MCG/2ML IJ SOLN
INTRAMUSCULAR | Status: AC
  Filled 2023-08-10: qty 2

## 2023-08-10 MED ORDER — FENTANYL CITRATE (PF) 100 MCG/2ML IJ SOLN: 25.0000 ug | INTRAMUSCULAR | Status: DC | PRN

## 2023-08-10 MED ORDER — ROCURONIUM BROMIDE 10 MG/ML (PF) SYRINGE
PREFILLED_SYRINGE | INTRAVENOUS | Status: AC
  Filled 2023-08-10: qty 10

## 2023-08-10 MED ORDER — SODIUM CHLORIDE 0.9 % IR SOLN
Status: DC | PRN
  Administered 2023-08-10: 1000 mL via INTRAVESICAL

## 2023-08-10 MED ORDER — ONDANSETRON HCL 4 MG/2ML IJ SOLN
4.0000 mg | Freq: Once | INTRAMUSCULAR | Status: AC | PRN
  Administered 2023-08-10: 4 mg via INTRAVENOUS

## 2023-08-10 MED ORDER — FENTANYL CITRATE (PF) 100 MCG/2ML IJ SOLN
INTRAMUSCULAR | Status: DC | PRN
  Administered 2023-08-10: 100 ug via INTRAVENOUS

## 2023-08-10 MED ORDER — IOHEXOL 300 MG/ML  SOLN
INTRAMUSCULAR | Status: DC | PRN
  Administered 2023-08-10: 7 mL

## 2023-08-10 SURGICAL SUPPLY — 44 items
BAG DRN RND TRDRP ANRFLXCHMBR (UROLOGICAL SUPPLIES) ×1
BAG URINE DRAIN 2000ML AR STRL (UROLOGICAL SUPPLIES) ×1 IMPLANT
BLADE CLIPPER SENSICLIP SURGIC (BLADE) ×1 IMPLANT
CATH FOLEY 2WAY SLVR 5CC 16FR (CATHETERS) ×1 IMPLANT
CATH ROBINSON RED A/P 16FR (CATHETERS) IMPLANT
CATH ROBINSON RED A/P 20FR (CATHETERS) ×1 IMPLANT
CLOTH BEACON ORANGE TIMEOUT ST (SAFETY) ×1 IMPLANT
CNTNR URN SCR LID CUP LEK RST (MISCELLANEOUS) ×1 IMPLANT
CONT SPEC 4OZ STRL OR WHT (MISCELLANEOUS) ×1
COVER BACK TABLE 60X90IN (DRAPES) ×1 IMPLANT
COVER MAYO STAND STRL (DRAPES) ×1 IMPLANT
DRSG TEGADERM 4X4.75 (GAUZE/BANDAGES/DRESSINGS) ×1 IMPLANT
DRSG TEGADERM 8X12 (GAUZE/BANDAGES/DRESSINGS) ×1 IMPLANT
GAUZE SPONGE 4X4 3PLY NS LF (GAUZE/BANDAGES/DRESSINGS) IMPLANT
GEL ULTRASOUND 20GR AQUASONIC (MISCELLANEOUS) ×2 IMPLANT
GLOVE BIO SURGEON STRL SZ 6.5 (GLOVE) IMPLANT
GLOVE BIO SURGEON STRL SZ7.5 (GLOVE) ×1 IMPLANT
GLOVE BIOGEL PI IND STRL 6.5 (GLOVE) IMPLANT
GLOVE SURG ORTHO 8.5 STRL (GLOVE) ×1 IMPLANT
GOWN STRL REUS W/TWL XL LVL3 (GOWN DISPOSABLE) ×1 IMPLANT
GRID BRACH TEMP 18GA 2.8X3X.75 (MISCELLANEOUS) ×1 IMPLANT
HOLDER FOLEY CATH W/STRAP (MISCELLANEOUS) IMPLANT
IMPL SPACEOAR VUE SYSTEM (Spacer) ×1 IMPLANT
IMPLANT SPACEOAR VUE SYSTEM (Spacer) ×1 IMPLANT
IV NS 1000ML (IV SOLUTION) ×1
IV NS 1000ML BAXH (IV SOLUTION) ×1 IMPLANT
KIT TURNOVER CYSTO (KITS) ×1 IMPLANT
NDL BRACHY 18G 5PK (NEEDLE) ×4 IMPLANT
NDL BRACHY 18G SINGLE (NEEDLE) IMPLANT
NDL PK MORGANSTERN STABILIZ (NEEDLE) ×1 IMPLANT
NEEDLE BRACHY 18G 5PK (NEEDLE) ×4 IMPLANT
NEEDLE BRACHY 18G SINGLE (NEEDLE) IMPLANT
NEEDLE PK MORGANSTERN STABILIZ (NEEDLE) ×1 IMPLANT
PACK CYSTO (CUSTOM PROCEDURE TRAY) ×1 IMPLANT
Quicklink seeds IMPLANT
SHEATH ULTRASOUND LF (SHEATH) IMPLANT
SHEATH ULTRASOUND LTX NONSTRL (SHEATH) IMPLANT
SLEEVE SCD COMPRESS KNEE MED (STOCKING) ×1 IMPLANT
SUT BONE WAX W31G (SUTURE) IMPLANT
SYR 10ML LL (SYRINGE) ×1 IMPLANT
SYR CONTROL 10ML LL (SYRINGE) ×1 IMPLANT
TOWEL OR 17X24 6PK STRL BLUE (TOWEL DISPOSABLE) ×1 IMPLANT
UNDERPAD 30X36 HEAVY ABSORB (UNDERPADS AND DIAPERS) ×2 IMPLANT
WATER STERILE IRR 500ML POUR (IV SOLUTION) ×1 IMPLANT

## 2023-08-10 NOTE — H&P (Signed)
CC/HPI: Pt presents today for pre-operative history and physical exam in anticipation of radioactive seed and space oar placement by Dr. Alvester Morin on 08/10/23. He is doing well and is without complaint.   Pt denies F/C, HA, CP, SOB, N/V, diarrhea/constipation, back pain, flank pain, hematuria, and dysuria.     HX:   04/12/2023  Patient underwent a repeat MRI of the prostate on 03/20/2023 that revealed a stable PI-RADS 4 lesion in the right posterior lateral peripheral zone. There is no evidence of trans capsular spread, SV involvement, neurovascular involvement, bone metastasis. Prostate size was 34 g by MRI. He subsequent underwent MRI/ultrasound fusion guided biopsy. Prostate size by ultrasound was also 34 g. Unfortunately, targeted lesion was positive for adenocarcinoma the prostate in all 4 cores, maximum Gleason score 4+3. He also had 6 out of 12 cores positive for prostate cancer on standard 12 core biopsy, all cores on the right were positive, maximum Gleason score 4+3.   06/01/2023  Patient met with Dr. Kathrynn Running. He elected to undergo brachytherapy with SpaceOAR. Scheduled in August. Underwent a PET scan that revealed no evidence of metastatic disease. Had clinically localized these.     ALLERGIES: None   MEDICATIONS: Omeprazole 40 mg capsule,delayed release  Atorvastatin Calcium 20 mg tablet  Levothyroxine Sodium 88 mcg capsule  Lisinopril-Hydrochlorothiazide 20 mg-12.5 mg tablet     GU PSH: Complex Uroflow - 2020 Prostate Needle Biopsy - 03/29/2023     NON-GU PSH: Appendectomy (laparoscopic) Surgical Pathology, Gross And Microscopic Examination For Prostate Needle - 03/29/2023 Visit Complexity (formerly GPC1X) - 06/01/2023, 04/12/2023     GU PMH: Prostate Cancer - 06/01/2023, - 04/12/2023 Elevated PSA - 12/29/2022, - 04/13/2022, - 01/24/2022, - 10/11/2021, - 2022, His PSA is up more. I will set him up for a prostate [place] and biopsy. I have reviewed the risks of bleeding, infection and voiding  difficuly. Levaquin sent. , - 2020, He has a minimal PSA elevation and a benign exam. I am going to monitor him with a repeat PSA prior to f/u. , - 2020 Prostate, Neoplasm of uncertain behavior - 12/29/2022 BPH w/LUTS - 04/13/2022, - 01/24/2022, - 10/11/2021, - 2022, - 2022, He will return for a flowrate and PVR and to discuss his PSA results. , - 2020 Weak Urinary Stream - 04/13/2022, - 2022 Encounter for Prostate Cancer screening - 01/24/2022, - 10/11/2021, - 2022 Nocturia - 01/24/2022, - 10/11/2021, - 2022, - 2022, - 2020 Urinary Urgency - 2022    NON-GU PMH: Arthritis Diabetes Type 2 GERD Hypertension Hypothyroidism    FAMILY HISTORY: None   SOCIAL HISTORY: Marital Status: Single Preferred Language: English; Race: Black or African American Current Smoking Status: Patient does not smoke anymore.   Tobacco Use Assessment Completed: Used Tobacco in last 30 days? Does not use smokeless tobacco. Does not drink anymore.  Does not use drugs. Drinks 1 caffeinated drink per day. Has not had a blood transfusion.    REVIEW OF SYSTEMS:    GU Review Male:   Patient denies frequent urination, hard to postpone urination, burning/ pain with urination, get up at night to urinate, leakage of urine, stream starts and stops, trouble starting your stream, have to strain to urinate , erection problems, and penile pain.  Gastrointestinal (Upper):   Patient denies nausea, vomiting, and indigestion/ heartburn.  Gastrointestinal (Lower):   Patient denies diarrhea and constipation.  Constitutional:   Patient denies fever, night sweats, weight loss, and fatigue.  Skin:   Patient denies skin rash/ lesion and  itching.  Eyes:   Patient denies blurred vision and double vision.  Ears/ Nose/ Throat:   Patient denies sore throat and sinus problems.  Hematologic/Lymphatic:   Patient denies swollen glands and easy bruising.  Cardiovascular:   Patient denies leg swelling and chest pains.  Respiratory:   Patient denies  cough and shortness of breath.  Endocrine:   Patient denies excessive thirst.  Musculoskeletal:   Patient denies back pain and joint pain.  Neurological:   Patient denies headaches and dizziness.  Psychologic:   Patient denies depression and anxiety.   VITAL SIGNS:      07/17/2023 02:29 PM  Weight 175 lb / 79.38 kg  Height 68 in / 172.72 cm  BP 144/66 mmHg  Heart Rate 64 /min  Temperature 97.8 F / 36.5 C  BMI 26.6 kg/m   MULTI-SYSTEM PHYSICAL EXAMINATION:    Constitutional: Well-nourished. No physical deformities. Normally developed. Good grooming.  Neck: Neck symmetrical, not swollen. Normal tracheal position.  Respiratory: Normal breath sounds. No labored breathing, no use of accessory muscles.   Cardiovascular: Regular rate and rhythm. No murmur, no gallop.   Lymphatic: No enlargement of neck, axillae, groin.  Skin: No paleness, no jaundice, no cyanosis. No lesion, no ulcer, no rash.  Neurologic / Psychiatric: Oriented to time, oriented to place, oriented to person. No depression, no anxiety, no agitation.  Gastrointestinal: No mass, no tenderness, no rigidity, non obese abdomen.  Eyes: Normal conjunctivae. Normal eyelids.  Ears, Nose, Mouth, and Throat: Left ear no scars, no lesions, no masses. Right ear no scars, no lesions, no masses. Nose no scars, no lesions, no masses. Normal hearing. Normal lips.  Musculoskeletal: Normal gait and station of head and neck.     Complexity of Data:  Records Review:   Previous Patient Records  Urine Test Review:   Urinalysis   07/17/23  Urinalysis  Urine Appearance Clear   Urine Color Yellow   Urine Glucose Neg mg/dL  Urine Bilirubin Neg mg/dL  Urine Ketones Neg mg/dL  Urine Specific Gravity 1.025   Urine Blood Neg ery/uL  Urine pH 6.5   Urine Protein Trace mg/dL  Urine Urobilinogen 1.0 mg/dL  Urine Nitrites Neg   Urine Leukocyte Esterase Neg leu/uL   PROCEDURES:          Urinalysis - 81003 Dipstick Dipstick Cont'd  Color:  Yellow Bilirubin: Neg mg/dL  Appearance: Clear Ketones: Neg mg/dL  Specific Gravity: 1.610 Blood: Neg ery/uL  pH: 6.5 Protein: Trace mg/dL  Glucose: Neg mg/dL Urobilinogen: 1.0 mg/dL    Nitrites: Neg    Leukocyte Esterase: Neg leu/uL    ASSESSMENT:      ICD-10 Details  1 GU:   Prostate Cancer - C61    PLAN:           Schedule Return Visit/Planned Activity: Keep Scheduled Appointment - Schedule Surgery          Document Letter(s):  Created for Patient: Clinical Summary         Notes:   There are no changes in the patients history or physical exam since last evaluation by Dr. Alvester Morin. Pt is scheduled to undergo seed and space oar placement on 08/10/23.   All pt's questions were answered to the best of my ability.          Next Appointment:      Next Appointment: 08/10/2023 12:00 PM    Appointment Type: Surgery     Location: [facility] [phone]  Provider: Modena Slater, Radene Knee, M.D.    Reason for Visit: OP NE SEEDS SPACE OAR      Signed by Ulyses Amor, PA on 07/17/23 at 2:41 PM (EDT

## 2023-08-10 NOTE — Progress Notes (Signed)
This patient returns to my office for at risk foot care.  This patient requires this care by a professional since this patient will be at risk due to having diabetes mellitus.    This patient is unable to cut nails himself since the patient cannot reach his nails.These nails are painful walking and wearing shoes.   Patient has painful callus under the forefoot  both feet.  The callus is painful walking and wearing his shoes. Patient is accompanied by male caregiver.This patient presents for at risk foot care today.  General Appearance  Alert, conversant and in no acute stress.  Vascular  Dorsalis pedis and posterior tibial  pulses are palpable  bilaterally.  Capillary return is within normal limits  bilaterally. Temperature is within normal limits  bilaterally.  Neurologic  Senn-Weinstein monofilament wire test within normal limits  bilaterally. Muscle power within normal limits bilaterally.  Nails Thick disfigured discolored nails with subungual debris  from hallux to fifth toes bilaterally. No evidence of bacterial infection or drainage bilaterally.  Orthopedic  No limitations of motion  feet .  No crepitus or effusions noted.  No bony pathology or digital deformities noted.  Skin  normotropic skin noted bilaterally.  No signs of infections or ulcers noted.   Porokeratotic lesions sub 1/4 B/L.  Onychomycosis  Porokeratosis  B/L.  Consent was obtained for treatment procedures.  Debride callus with # 15 blade. Nails performed as a courtesy. Filed with dremel without incident.    Return office visit   10 weeks                 Told patient to return for periodic foot care and evaluation due to potential at risk complications.   Helane Gunther DPM

## 2023-08-10 NOTE — Anesthesia Postprocedure Evaluation (Signed)
Anesthesia Post Note  Patient: Melvin Warner  Procedure(s) Performed: RADIOACTIVE SEED IMPLANT/BRACHYTHERAPY IMPLANT SPACE OAR INSTILLATION     Patient location during evaluation: PACU Anesthesia Type: General Level of consciousness: awake Pain management: pain level controlled Vital Signs Assessment: post-procedure vital signs reviewed and stable Respiratory status: spontaneous breathing Cardiovascular status: stable Postop Assessment: no headache Anesthesia complication: PONV.  No notable events documented.  Last Vitals:  Vitals:   08/10/23 1345 08/10/23 1400  BP: 135/69 126/78  Pulse: 75 (!) 56  Resp: (!) 21 15  Temp:    SpO2: 97% 94%    Last Pain:  Vitals:   08/10/23 1400  TempSrc:   PainSc: 0-No pain                 John F Scharlene Corn

## 2023-08-10 NOTE — Op Note (Signed)
PATIENT:  Melvin Warner  PRE-OPERATIVE DIAGNOSIS:  Adenocarcinoma of the prostate  POST-OPERATIVE DIAGNOSIS:  Same  PROCEDURE:  1. I-125 radioactive seed implantation 2. Cystoscopy  3. Placement of SpaceOAR  SURGEON:  Surgeon(s): Rutherford Nail, MD  Radiation oncologist: Dr. Margaretmary Dys  ANESTHESIA:  General  EBL:  Minimal  DRAINS: 16 French Foley catheter  INDICATION: [place]  Description of procedure: After informed consent the patient was brought to the major OR, placed on the table and administered general anesthesia. He was then moved to the modified lithotomy position with his perineum perpendicular to the floor. His perineum and genitalia were then sterilely prepped. An official timeout was then performed. A 16 French Foley catheter was then placed in the bladder and filled with dilute contrast, a rectal tube was placed in the rectum and the transrectal ultrasound probe was placed in the rectum and affixed to the stand. He was then sterilely draped.  Real time ultrasonography was used along with the seed planning software [employer] Prostate. This was used to develop the seed plan including the number of needles as well as number of seeds required for complete and adequate coverage. Real-time ultrasonography was then used along with the previously developed plan and the Nucletron device to implant a total of 67 seeds using 16 needles. This proceeded without difficulty or complication.   I then proceeded with placement of SpaceOAR by introducing a needle with the bevel angled inferiorly approximately 2 cm superior to the anus. This was angled downward and under direct ultrasound was placed within the space between the prostatic capsule and rectum. This was confirmed with a small amount of sterile saline injected and this was performed under direct ultrasound. I then attached the SpaceOAR to the needle and injected this in the space between the prostate and rectum with  good placement noted.  A Foley catheter was then removed as well as the transrectal ultrasound probe and rectal probe. Flexible cystoscopy was then performed using the 17 French flexible scope which revealed a normal urethra throughout its length down to the sphincter which appeared intact. The prostatic urethra revealed bilobar hypertrophy but no evidence of obstruction, seeds, spacers or lesions. The bladder was then entered and fully and systematically inspected. The ureteral orifices were noted to be of normal configuration and position. The mucosa revealed no evidence of tumors. There were also no stones identified within the bladder. I noted no seeds or spacers on the floor of the bladder and retroflexion of the scope revealed no seeds protruding from the base of the prostate.  The cystoscope was then removed and the patient was awakened and taken to recovery room in stable and satisfactory condition. He tolerated procedure well and there were no intraoperative complications.

## 2023-08-10 NOTE — Anesthesia Procedure Notes (Signed)
Procedure Name: Intubation Date/Time: 08/10/2023 12:23 PM  Performed by: Bishop Limbo, CRNAPre-anesthesia Checklist: Patient identified, Emergency Drugs available, Suction available and Patient being monitored Patient Re-evaluated:Patient Re-evaluated prior to induction Oxygen Delivery Method: Circle System Utilized Preoxygenation: Pre-oxygenation with 100% oxygen Induction Type: IV induction Ventilation: Mask ventilation without difficulty Laryngoscope Size: Mac and 4 Grade View: Grade II Tube type: Oral Tube size: 7.0 mm Number of attempts: 1 Airway Equipment and Method: Stylet Placement Confirmation: ETT inserted through vocal cords under direct vision, positive ETCO2 and breath sounds checked- equal and bilateral Secured at: 22 cm Tube secured with: Tape Dental Injury: Teeth and Oropharynx as per pre-operative assessment

## 2023-08-10 NOTE — Discharge Instructions (Addendum)
Antibiotics °You may be given a prescription for an antibiotic to take when you arrive home. If so, be sure to take every tablet in the bottle, even if you are feeling better before the prescription is finished. If you begin itching, notice a rash or start to swell on your trunk, arms, legs and/or throat, immediately stop taking the antibiotic and call your Urologist. °Diet °Resume your usual diet when you return home. To keep your bowels moving easily and softly, drink prune, apple and cranberry juice at room temperature. You may also take a stool softener, such as Colace, which is available without prescription at local pharmacies. °Daily activities °? No driving or heavy lifting for at least two days after the implant. °? No bike riding, horseback riding or riding lawn mowers for the first month after the implant. °? Any strenuous physical activity should be approved by your doctor before you resume it. °Sexual relations °You may resume sexual relations two weeks after the procedure. A condom should be used for the first two weeks. Your semen may be dark brown or black; this is normal and is related bleeding that may have occurred during the implant. °Postoperative swelling °Expect swelling and bruising of the scrotum and perineum (the area between the scrotum and anus). Both the swelling and the bruising should resolve in l or 2 weeks. Ice packs and over- the-counter medications such as Tylenol, Advil or Aleve may lessen your discomfort. °Postoperative urination °Most men experience burning on urination and/or urinary frequency. If this becomes bothersome, contact your Urologist.  Medication can be prescribed to relieve these problems.  It is normal to have some blood in your urine for a few days after the implant. °Special instructions related to the seeds °It is unlikely that you will pass an Iodine-125 seed in your urine. The seeds are silver in color and are about as large as a grain of rice. If you pass a  seed, do not handle it with your fingers. Use a spoon to place it in an envelope or jar in place this in base occluded area such as the garage or basement for return to the radiation clinic at your convenience. ° °Contact your doctor for °? Temperature greater than 101 F °? Increasing pain °? Inability to urinate °Follow-up ° You should have follow up with your urologist and radiation oncologist about 3 weeks after the procedure. °General information regarding Iodine seeds °? Iodine-125 is a low energy radioactive material. It is not deeply penetrating and loses energy at short distances. Your prostate will absorb the radiation. Objects that are touched or used by the patient do not become radioactive. °? Body wastes (urine and stool) or body fluids (saliva, tears, semen or blood) are not radioactive. °? The Nuclear Regulatory Commission (NRC) has determined that no radiation precautions are needed for patients undergoing Iodine-125 seed implantation. The NRC states that such patients do not present a risk to the people around them, including young children and pregnant women. However, in keeping with the general principle that radiation exposure should be kept as low reasonably possible, we suggest the following: °? Children and pets should not sit on the patient's lap for the first two (2) weeks after the implant. °? Pregnant (or possibly pregnant) women should avoid prolonged, close contact with the patient for the first two (2) weeks after the implant. °? A distance of three (3) feet is acceptable. °? At a distance of three (3) feet, there is no limit to the   Home Care Instructions  Activity: Get plenty of rest for the remainder of the day. A responsible individual must stay with you for 24 hours following the procedure.  For the next 24 hours, DO NOT: -Drive a car -Operate machinery -Drink alcoholic beverages -Take any medication unless instructed by your  physician -Make any legal decisions or sign important papers.  Meals: Start with liquid foods such as gelatin or soup. Progress to regular foods as tolerated. Avoid greasy, spicy, heavy foods. If nausea and/or vomiting occur, drink only clear liquids until the nausea and/or vomiting subsides. Call your physician if vomiting continues.  Special Instructions/Symptoms: Your throat may feel dry or sore from the anesthesia or the breathing tube placed in your throat during surgery. If this causes discomfort, gargle with warm salt water. The discomfort should disappear within 24 hours.      

## 2023-08-10 NOTE — Transfer of Care (Signed)
Immediate Anesthesia Transfer of Care Note  Patient: Melvin Warner  Procedure(s) Performed: RADIOACTIVE SEED IMPLANT/BRACHYTHERAPY IMPLANT SPACE OAR INSTILLATION  Patient Location: PACU  Anesthesia Type:General  Level of Consciousness: drowsy, patient cooperative, and responds to stimulation  Airway & Oxygen Therapy: Patient Spontanous Breathing and Patient connected to face mask oxygen  Post-op Assessment: Report given to RN and Post -op Vital signs reviewed and stable  Post vital signs: Reviewed and stable  Last Vitals:  Vitals Value Taken Time  BP 142/71 08/10/23 1333  Temp    Pulse 62 08/10/23 1337  Resp 17 08/10/23 1337  SpO2 96 % 08/10/23 1337  Vitals shown include unfiled device data.  Last Pain:  Vitals:   08/10/23 1039  TempSrc: Oral         Complications: No notable events documented.

## 2023-08-10 NOTE — Anesthesia Preprocedure Evaluation (Addendum)
Anesthesia Evaluation  Patient identified by MRN, date of birth, ID band Patient awake    Reviewed: Allergy & Precautions, NPO status , Patient's Chart, lab work & pertinent test results  Airway Mallampati: I       Dental  (+) Lower Dentures, Upper Dentures   Pulmonary former smoker   Pulmonary exam normal        Cardiovascular hypertension, Pt. on medications Normal cardiovascular exam     Neuro/Psych  PSYCHIATRIC DISORDERS Anxiety        GI/Hepatic   Endo/Other  diabetes, Type 2, Oral Hypoglycemic AgentsHypothyroidism    Renal/GU   negative genitourinary   Musculoskeletal   Abdominal Normal abdominal exam  (+)   Peds  Hematology negative hematology ROS (+)   Anesthesia Other Findings   Reproductive/Obstetrics                             Anesthesia Physical Anesthesia Plan  ASA: 3  Anesthesia Plan: General   Post-op Pain Management: Minimal or no pain anticipated   Induction: Intravenous  PONV Risk Score and Plan: 2 and Ondansetron and Treatment may vary due to age or medical condition  Airway Management Planned: LMA  Additional Equipment: None  Intra-op Plan:   Post-operative Plan: Extubation in OR  Informed Consent: I have reviewed the patients History and Physical, chart, labs and discussed the procedure including the risks, benefits and alternatives for the proposed anesthesia with the patient or authorized representative who has indicated his/her understanding and acceptance.     Dental advisory given  Plan Discussed with: CRNA  Anesthesia Plan Comments:        Anesthesia Quick Evaluation

## 2023-08-12 NOTE — Progress Notes (Signed)
Radiation Oncology         [phone] ________________________________  Name: CHEVAS COLYER MRN: [id number]  Date: 08/12/2023  DOB: Oct 31, 1951       Prostate Seed Implant  FA:OZHY, Kathleene Hazel, MD  No ref. provider found  DIAGNOSIS:  72 y.o. gentleman with Stage T1c adenocarcinoma of the prostate with Gleason score of 4+3, and PSA of 12.30.   Oncology History  Malignant neoplasm of prostate (HCC)  03/29/2023 Cancer Staging   Staging form: Prostate, AJCC 8th Edition - Clinical stage from 03/29/2023: Stage IIC (cT1c, cN0, cM0, PSA: 12.3, Grade Group: 3) - Signed by Marcello Fennel, PA-C on 05/17/2023 Histopathologic type: Adenocarcinoma, NOS Stage prefix: Initial diagnosis Prostate specific antigen (PSA) range: 10 to 19 Gleason primary pattern: 4 Gleason secondary pattern: 3 Gleason score: 7 Histologic grading system: 5 grade system Number of biopsy cores examined: 16 Number of biopsy cores positive: 10 Location of positive needle core biopsies: One side   05/17/2023 Initial Diagnosis   Malignant neoplasm of prostate (HCC)       ICD-10-CM   1. Pre-op testing  Z01.818 CANCELED: CBG per Guidelines for Diabetes Management for Patients Undergoing Surgery ([facility], [facility], and [facility] only)    CANCELED: CBG per protocol    CANCELED: I-Stat, Chem 8 on day of surgery per protocol    CANCELED: EKG 12 lead per protocol    CANCELED: CBG per Guidelines for Diabetes Management for Patients Undergoing Surgery ([facility], [facility], and [facility] only)    CANCELED: CBG per protocol    CANCELED: I-Stat, Chem 8 on day of surgery per protocol      PROCEDURE: Insertion of radioactive I-125 seeds into the prostate gland.  RADIATION DOSE: 145 Gy, definitive therapy.  TECHNIQUE: SHAQUILLE MARCHETTA was brought to the operating room with the urologist. He was placed in the dorsolithotomy position. He was catheterized and a rectal tube was inserted. The perineum was shaved, prepped and draped. The ultrasound probe was then introduced by  me into the rectum to see the prostate gland.  TREATMENT DEVICE: I attached the needle grid to the ultrasound probe stand and anchor needles were placed.  3D PLANNING: The prostate was imaged in 3D using a sagittal sweep of the prostate probe. These images were transferred to the planning computer. There, the prostate, urethra and rectum were defined on each axial reconstructed image. Then, the software created an optimized 3D plan and a few seed positions were adjusted. The quality of the plan was reviewed using [facility] information for the target and the following two organs at risk:  Urethra and Rectum.  Then the accepted plan was printed and handed off to the radiation therapist.  Under my supervision, the custom loading of the seeds and spacers was carried out using the quick loader.  These pre-loaded needles were then placed into the needle holder.Marland Kitchen  PROSTATE VOLUME STUDY:  Using transrectal ultrasound the volume of the prostate was verified to be 42.4 cc.  SPECIAL TREATMENT PROCEDURE/SUPERVISION AND HANDLING: The pre-loaded needles were then delivered by the urologist under sagittal guidance. A total of 16 needles were used to deposit 67 seeds in the prostate gland. The individual seed activity was 0.427 mCi.  SpaceOAR:  Yes  COMPLEX SIMULATION: At the end of the procedure, an anterior radiograph of the pelvis was obtained to document seed positioning and count. Cystoscopy was performed by the urologist to check the urethra and bladder.  MICRODOSIMETRY: At the end of the procedure, the patient was emitting  0.092 mR/hr at 1 meter. Accordingly, he was considered safe for hospital discharge.  PLAN: The patient will return to the radiation oncology clinic for post implant CT dosimetry in three weeks.   ________________________________  Artist Pais Kathrynn Running, M.D.

## 2023-08-13 ENCOUNTER — Encounter (HOSPITAL_BASED_OUTPATIENT_CLINIC_OR_DEPARTMENT_OTHER): Payer: Self-pay | Admitting: Urology

## 2023-08-29 ENCOUNTER — Telehealth: Payer: Self-pay | Admitting: *Deleted

## 2023-08-29 NOTE — Telephone Encounter (Signed)
CALLED PATIENT'S EMERGENCY CONTACT- DONNA COLTRANE TO REMIND OF PATIENT'S POST SEED APPTS. FOR 08-30-23, LVM FOR A RETURN CALL

## 2023-08-29 NOTE — Telephone Encounter (Signed)
RETURNED PATIENT'S FRIEND (DONNA COLTRANE) CALL, LVM FOR A RETURN CALL

## 2023-08-30 ENCOUNTER — Ambulatory Visit
Admission: RE | Admit: 2023-08-30 | Discharge: 2023-08-30 | Disposition: A | Discharge status: Home / Self Care | Payer: Medicare HMO | Source: Ambulatory Visit | Attending: Urology | Admitting: Urology

## 2023-08-30 ENCOUNTER — Ambulatory Visit
Admission: RE | Admit: 2023-08-30 | Discharge: 2023-08-30 | Disposition: A | Discharge status: Home / Self Care | Payer: Medicare HMO | Source: Ambulatory Visit | Attending: Radiation Oncology | Admitting: Radiation Oncology

## 2023-08-30 ENCOUNTER — Encounter: Payer: Self-pay | Admitting: Urology

## 2023-08-30 VITALS — BP 139/73 | HR 67 | Temp 97.5°F | Resp 20 | Ht 70.0 in | Wt 173.0 lb

## 2023-08-30 DIAGNOSIS — Z923 Personal history of irradiation: Secondary | ICD-10-CM | POA: Insufficient documentation

## 2023-08-30 DIAGNOSIS — Z7984 Long term (current) use of oral hypoglycemic drugs: Secondary | ICD-10-CM | POA: Insufficient documentation

## 2023-08-30 DIAGNOSIS — Z79899 Other long term (current) drug therapy: Secondary | ICD-10-CM | POA: Insufficient documentation

## 2023-08-30 DIAGNOSIS — C61 Malignant neoplasm of prostate: Secondary | ICD-10-CM

## 2023-08-30 NOTE — Progress Notes (Signed)
Post-seed nursing interview for a diagnosis of Malignant neoplasm of prostate (HCC).  Patient identity verified x2. Patient reports mild urinary/ groin discomfort but is overall doing well. No other issues conveyed at this time.  Meaningful use complete.  I-PSS score- 9 - Mild SHIM score- 1 Urinary Management medication(s) Tamsulosin Urology appointment date- 09/2023 with Dr. Alvester Morin at [facility]  Vitals- BP 139/73 (BP Location: Left Arm, Patient Position: Sitting, Cuff Size: Normal)   Pulse 67   Temp (!) 97.5 F (36.4 C) (Oral)   Resp 20   Ht 5\' 10"  (1.778 m)   Wt 173 lb (78.5 kg)   SpO2 100%   BMI 24.82 kg/m   This concludes the interaction.  Ruel Favors, LPN

## 2023-08-30 NOTE — Progress Notes (Signed)
Radiation Oncology         [phone]) [phone] ________________________________  Name: Melvin Warner MRN: [id number]  Date: 08/30/2023  DOB: 1951-03-23  COMPLEX SIMULATION NOTE  NARRATIVE:  The patient was brought to the CT Simulation planning suite today following prostate seed implantation approximately one month ago.  Identity was confirmed.  All relevant records and images related to the planned course of therapy were reviewed.  Then, the patient was set-up supine.  CT images were obtained.  The CT images were loaded into the planning software.  Then the prostate and rectum were contoured.  Treatment planning then occurred.  The implanted iodine 125 seeds were identified by the physics staff for projection of radiation distribution  I have requested : 3D Simulation  I have requested a DVH of the following structures: Prostate and rectum.    ________________________________  Artist Pais Kathrynn Running, M.D.

## 2023-08-30 NOTE — Progress Notes (Signed)
Radiation Oncology         [phone] ________________________________  Name: STRIDER ALI MRN: [id number]  Date: 08/30/2023  DOB: 1951/07/09  Post-Seed Follow-Up Visit Note  CC: Benita Stabile, MD  Benita Stabile, MD  Diagnosis:   72 y.o. gentleman with Stage T1c adenocarcinoma of the prostate with Gleason score of 4+3, and PSA of 12.30.     ICD-10-CM   1. Malignant neoplasm of prostate (HCC)  C61       Interval Since Last Radiation:  3 weeks 08/10/23:  Insertion of radioactive I-125 seeds into the prostate gland; 145 Gy, definitive therapy with placement of SpaceOAR gel.  Narrative:  The patient returns today for routine follow-up.  He is complaining of increased urinary frequency and urinary hesitation symptoms. He filled out a questionnaire regarding urinary function today providing and overall IPSS score of 9 characterizing his symptoms as mild-moderate.  His pre-implant score was 16 on Flomax daily.  He has continued taking the Flomax daily and reports that his LUTS are manageable and not bothersome.  He denies any abdominal pain or bowel symptoms.  He has not noticed any significant decrease in his energy level and is eager to resume his normal daily activities.  Overall, he is quite pleased with his progress to date.  ALLERGIES:  is allergic to tomato.  Meds: Current Outpatient Medications  Medication Sig Dispense Refill   Accu-Chek Softclix Lancets lancets 3 (three) times daily.     acetaminophen (TYLENOL) 500 MG tablet Take 500 mg by mouth every 6 (six) hours as needed.     ALPRAZolam (XANAX) 0.25 MG tablet 2 (two) times daily as needed.     amLODipine (NORVASC) 5 MG tablet Take 5 mg by mouth at bedtime.     atorvastatin (LIPITOR) 20 MG tablet Take 20 mg by mouth at bedtime.     Blood Glucose Monitoring Suppl (ACCU-CHEK GUIDE ME) w/Device KIT Accu-Chek Guide Glucose Meter  USE AS DIRECTED TO check blood sugar THREE TIMES DAILY     glucose blood (ACCU-CHEK GUIDE) test strip  Accu-Chek Guide test strips  check blood sugar THREE TIMES DAILY     HYDROcodone-acetaminophen (NORCO/VICODIN) 5-325 MG tablet Take 1 tablet by mouth every 4 (four) hours as needed. 10 tablet 0   influenza vac split quadrivalent PF (FLUARIX) 0.5 ML injection Fluzone Quad 2018-19(PF) 60 mcg(15 mcgx4)/0.5 mL intramuscular syringe  TO BE ADMINISTERED BY PHARMACIST FOR IMMUNIZATION     levothyroxine (SYNTHROID) 100 MCG tablet Take 100 mcg by mouth at bedtime.     lisinopril-hydrochlorothiazide (PRINZIDE,ZESTORETIC) 20-12.5 MG tablet Take 1 tablet by mouth at bedtime.     losartan (COZAAR) 50 MG tablet Take 50 mg by mouth at bedtime.     metFORMIN (GLUCOPHAGE) 500 MG tablet Take 500 mg by mouth 2 (two) times daily with a meal.  (Patient not taking: Reported on 07/31/2023)     omeprazole (PRILOSEC) 40 MG capsule Take 40 mg by mouth at bedtime.  6   rosuvastatin (CRESTOR) 10 MG tablet Take 10 mg by mouth at bedtime.     sertraline (ZOLOFT) 25 MG tablet Take 25 mg by mouth at bedtime.     tamsulosin (FLOMAX) 0.4 MG CAPS capsule tamsulosin 0.4 mg capsule  TAKE ONE CAPSULE BY MOUTH EVERY EVENING     Vitamin D, Ergocalciferol, (DRISDOL) 1.25 MG (50000 UNIT) CAPS capsule Take 50,000 Units by mouth once a week.     Zoster Vaccine Adjuvanted [facility]) injection PHARMACY ADMINISTERED  No current facility-administered medications for this encounter.    Physical Findings: In general this is a well appearing African-American male in no acute distress. He's alert and oriented x4 and appropriate throughout the examination. Cardiopulmonary assessment is negative for acute distress and he exhibits normal effort.   Lab Findings: Lab Results  Component Value Date   WBC 5.1 12/05/2021   HGB 14.6 08/10/2023   HCT 43.0 08/10/2023   MCV 86.9 12/05/2021   PLT 318 12/05/2021    Radiographic Findings:  Patient underwent CT imaging in our clinic for post implant dosimetry. The CT will be reviewed by Dr. Kathrynn Running  to confirm there is an adequate distribution of radioactive seeds throughout the prostate gland and ensure that there are no seeds in or near the rectum.  We suspect the final radiation plan and dosimetry will show appropriate coverage of the prostate gland. He understands that we will call and inform him of any unexpected findings on further review of his imaging and dosimetry.  Impression/Plan: 72 y.o. gentleman with Stage T1c adenocarcinoma of the prostate with Gleason score of 4+3, and PSA of 12.30.  The patient is recovering from the effects of radiation. His urinary symptoms should gradually improve over the next 4-6 months. We talked about this today. He is encouraged by his improvement already and is otherwise pleased with his outcome. We also talked about long-term follow-up for prostate cancer following seed implant. He understands that ongoing PSA determinations and digital rectal exams will help perform surveillance to rule out disease recurrence. He has a follow up appointment scheduled with Anne Fu, NP on 09/07/2023 and will be scheduled for his initial posttreatment PSA lab in December 2024, prior to a follow-up visit with Dr. Alvester Morin. He understands what to expect with his PSA measures. Patient was also educated today about some of the long-term effects from radiation including a small risk for rectal bleeding and possibly erectile dysfunction. We talked about some of the general management approaches to these potential complications. However, I did encourage the patient to contact our office or return at any point if he has questions or concerns related to his previous radiation and prostate cancer.    Marguarite Arbour, PA-C

## 2023-09-07 DIAGNOSIS — C61 Malignant neoplasm of prostate: Secondary | ICD-10-CM | POA: Diagnosis not present

## 2023-09-10 ENCOUNTER — Encounter: Payer: Self-pay | Admitting: Radiation Oncology

## 2023-09-10 ENCOUNTER — Ambulatory Visit
Admission: RE | Admit: 2023-09-10 | Discharge: 2023-09-10 | Disposition: A | Discharge status: Home / Self Care | Payer: Medicare HMO | Source: Ambulatory Visit | Attending: Radiation Oncology | Admitting: Radiation Oncology

## 2023-09-10 DIAGNOSIS — C61 Malignant neoplasm of prostate: Secondary | ICD-10-CM | POA: Diagnosis not present

## 2023-09-10 DIAGNOSIS — Z191 Hormone sensitive malignancy status: Secondary | ICD-10-CM | POA: Diagnosis not present

## 2023-09-10 NOTE — Progress Notes (Signed)
Radiation Oncology         [phone]) [phone] ________________________________  Name: Melvin Warner MRN: [id number]  Date: 09/10/2023  DOB: [birth year]  3D Planning Note   Prostate Brachytherapy Post-Implant Dosimetry  Diagnosis: 72 y.o. gentleman with Stage T1c adenocarcinoma of the prostate with Gleason score of 4+3, and PSA of 12.30.   Narrative: On a previous date, [name] returned following prostate seed implantation for post implant planning. He underwent CT scan complex simulation to delineate the three-dimensional structures of the pelvis and demonstrate the radiation distribution.  Since that time, the seed localization, and complex isodose planning with dose volume histograms have now been completed.  Results:   Prostate Coverage - The dose of radiation delivered to the 90% or more of the prostate gland (D90) was 111.5% of the prescription dose. This exceeds our goal of greater than 90%. Rectal Sparing - The volume of rectal tissue receiving the prescription dose or higher was 0.0 cc. This falls under our thresholds tolerance of 1.0 cc.  Impression: The prostate seed implant appears to show adequate target coverage and appropriate rectal sparing.  Plan:  The patient will continue to follow with urology for ongoing PSA determinations. I would anticipate a high likelihood for local tumor control with minimal risk for rectal morbidity.  ________________________________  Artist Pais Kathrynn Running, M.D.

## 2023-09-12 NOTE — Radiation Completion Notes (Addendum)
  Radiation Oncology         [phone] ________________________________  Name: Melvin Warner MRN: [id number]  Date: 09/10/2023  DOB: 07/02/1951  Date of Service: 2023-09-12 Radiation Oncologist: Adina Barge, M.D. [facility]     RADIATION ONCOLOGY END OF TREATMENT NOTE     Diagnosis:  72 y.o. gentleman with Stage T1c adenocarcinoma of the prostate with Gleason score of 4+3, and PSA of 12.30.   Intent: Curative     ==========DELIVERED PLANS==========  Prostate Seed Implant Date: 2023-08-10   Plan Name: Prostate Seed Implant Site: Prostate Technique: Radioactive Seed Implant I-125 Mode: Brachytherapy Dose Per Fraction: 145 Gy Prescribed Dose (Delivered / Prescribed): 145 Gy / 145 Gy Prescribed Fxs (Delivered / Prescribed): 1 / 1     ==========ON TREATMENT VISIT DATES========== 2023-08-10     The patient will receive a call in about one month from the radiation oncology department. He will continue follow up with Dr. Carolee as well.  ------------------------------------------------   [name], MD [facility]  Radiation Oncology Direct Dial: [phone]  Fax: [phone] [email or web address]  Skype  LinkedIn

## 2023-09-20 ENCOUNTER — Encounter: Payer: Self-pay | Admitting: *Deleted

## 2023-09-21 ENCOUNTER — Encounter: Payer: Self-pay | Admitting: *Deleted

## 2023-09-25 DIAGNOSIS — E782 Mixed hyperlipidemia: Secondary | ICD-10-CM | POA: Diagnosis not present

## 2023-09-25 DIAGNOSIS — E559 Vitamin D deficiency, unspecified: Secondary | ICD-10-CM | POA: Diagnosis not present

## 2023-09-25 DIAGNOSIS — E1169 Type 2 diabetes mellitus with other specified complication: Secondary | ICD-10-CM | POA: Diagnosis not present

## 2023-09-25 DIAGNOSIS — E039 Hypothyroidism, unspecified: Secondary | ICD-10-CM | POA: Diagnosis not present

## 2023-09-27 ENCOUNTER — Encounter: Payer: Self-pay | Admitting: *Deleted

## 2023-10-01 DIAGNOSIS — E1165 Type 2 diabetes mellitus with hyperglycemia: Secondary | ICD-10-CM | POA: Diagnosis not present

## 2023-10-01 DIAGNOSIS — I10 Essential (primary) hypertension: Secondary | ICD-10-CM | POA: Diagnosis not present

## 2023-10-01 DIAGNOSIS — F411 Generalized anxiety disorder: Secondary | ICD-10-CM | POA: Diagnosis not present

## 2023-10-01 DIAGNOSIS — E782 Mixed hyperlipidemia: Secondary | ICD-10-CM | POA: Diagnosis not present

## 2023-10-01 DIAGNOSIS — Z23 Encounter for immunization: Secondary | ICD-10-CM | POA: Diagnosis not present

## 2023-10-01 DIAGNOSIS — E1169 Type 2 diabetes mellitus with other specified complication: Secondary | ICD-10-CM | POA: Diagnosis not present

## 2023-10-01 DIAGNOSIS — E1159 Type 2 diabetes mellitus with other circulatory complications: Secondary | ICD-10-CM | POA: Diagnosis not present

## 2023-10-01 DIAGNOSIS — E039 Hypothyroidism, unspecified: Secondary | ICD-10-CM | POA: Diagnosis not present

## 2023-10-01 DIAGNOSIS — E559 Vitamin D deficiency, unspecified: Secondary | ICD-10-CM | POA: Diagnosis not present

## 2023-11-06 ENCOUNTER — Encounter: Payer: Self-pay | Admitting: *Deleted

## 2023-11-06 ENCOUNTER — Inpatient Hospital Stay: Payer: Medicare HMO | Attending: Adult Health | Admitting: *Deleted

## 2023-11-08 ENCOUNTER — Ambulatory Visit (INDEPENDENT_AMBULATORY_CARE_PROVIDER_SITE_OTHER): Payer: Medicare HMO | Admitting: Podiatry

## 2023-11-08 ENCOUNTER — Encounter: Payer: Self-pay | Admitting: Podiatry

## 2023-11-08 DIAGNOSIS — E1169 Type 2 diabetes mellitus with other specified complication: Secondary | ICD-10-CM

## 2023-11-08 DIAGNOSIS — B351 Tinea unguium: Secondary | ICD-10-CM

## 2023-11-08 DIAGNOSIS — M79675 Pain in left toe(s): Secondary | ICD-10-CM

## 2023-11-08 DIAGNOSIS — M79674 Pain in right toe(s): Secondary | ICD-10-CM

## 2023-11-08 DIAGNOSIS — Q828 Other specified congenital malformations of skin: Secondary | ICD-10-CM

## 2023-11-08 NOTE — Progress Notes (Signed)
This patient returns to my office for at risk foot care.  This patient requires this care by a professional since this patient will be at risk due to having diabetes mellitus.    This patient is unable to cut nails himself since the patient cannot reach his nails.These nails are painful walking and wearing shoes.   Patient has painful callus under the forefoot  both feet.  The callus is painful walking and wearing his shoes. Patient is accompanied by male caregiver.This patient presents for at risk foot care today.  General Appearance  Alert, conversant and in no acute stress.  Vascular  Dorsalis pedis and posterior tibial  pulses are palpable  bilaterally.  Capillary return is within normal limits  bilaterally. Temperature is within normal limits  bilaterally.  Neurologic  Senn-Weinstein monofilament wire test within normal limits  bilaterally. Muscle power within normal limits bilaterally.  Nails Thick disfigured discolored nails with subungual debris  from hallux to fifth toes bilaterally. No evidence of bacterial infection or drainage bilaterally.  Orthopedic  No limitations of motion  feet .  No crepitus or effusions noted.  No bony pathology or digital deformities noted.  Skin  normotropic skin noted bilaterally.  No signs of infections or ulcers noted.   Porokeratotic lesions sub 1/4 B/L.  Onychomycosis  Porokeratosis  B/L.  Consent was obtained for treatment procedures.  Debride callus with # 15 blade. Nails performed as a courtesy. Filed with dremel without incident.    Return office visit   10 weeks                 Told patient to return for periodic foot care and evaluation due to potential at risk complications.   Helane Gunther DPM

## 2023-11-09 ENCOUNTER — Encounter: Payer: Self-pay | Admitting: *Deleted

## 2023-11-09 ENCOUNTER — Ambulatory Visit: Payer: Medicare HMO | Admitting: Podiatry

## 2023-12-07 DIAGNOSIS — C61 Malignant neoplasm of prostate: Secondary | ICD-10-CM | POA: Diagnosis not present

## 2024-01-04 DIAGNOSIS — E782 Mixed hyperlipidemia: Secondary | ICD-10-CM | POA: Diagnosis not present

## 2024-01-04 DIAGNOSIS — E1169 Type 2 diabetes mellitus with other specified complication: Secondary | ICD-10-CM | POA: Diagnosis not present

## 2024-01-04 DIAGNOSIS — E039 Hypothyroidism, unspecified: Secondary | ICD-10-CM | POA: Diagnosis not present

## 2024-01-04 DIAGNOSIS — E559 Vitamin D deficiency, unspecified: Secondary | ICD-10-CM | POA: Diagnosis not present

## 2024-01-09 DIAGNOSIS — E039 Hypothyroidism, unspecified: Secondary | ICD-10-CM | POA: Diagnosis not present

## 2024-01-09 DIAGNOSIS — C61 Malignant neoplasm of prostate: Secondary | ICD-10-CM | POA: Diagnosis not present

## 2024-01-09 DIAGNOSIS — N401 Enlarged prostate with lower urinary tract symptoms: Secondary | ICD-10-CM | POA: Diagnosis not present

## 2024-01-09 DIAGNOSIS — E782 Mixed hyperlipidemia: Secondary | ICD-10-CM | POA: Diagnosis not present

## 2024-01-09 DIAGNOSIS — I10 Essential (primary) hypertension: Secondary | ICD-10-CM | POA: Diagnosis not present

## 2024-01-09 DIAGNOSIS — K219 Gastro-esophageal reflux disease without esophagitis: Secondary | ICD-10-CM | POA: Diagnosis not present

## 2024-01-09 DIAGNOSIS — F411 Generalized anxiety disorder: Secondary | ICD-10-CM | POA: Diagnosis not present

## 2024-01-09 DIAGNOSIS — E559 Vitamin D deficiency, unspecified: Secondary | ICD-10-CM | POA: Diagnosis not present

## 2024-01-09 DIAGNOSIS — E1169 Type 2 diabetes mellitus with other specified complication: Secondary | ICD-10-CM | POA: Diagnosis not present

## 2024-02-07 ENCOUNTER — Ambulatory Visit: Payer: Medicare HMO | Admitting: Podiatry

## 2024-02-07 ENCOUNTER — Encounter: Payer: Self-pay | Admitting: Podiatry

## 2024-02-07 DIAGNOSIS — E1169 Type 2 diabetes mellitus with other specified complication: Secondary | ICD-10-CM

## 2024-02-07 DIAGNOSIS — Q828 Other specified congenital malformations of skin: Secondary | ICD-10-CM | POA: Diagnosis not present

## 2024-02-07 DIAGNOSIS — B351 Tinea unguium: Secondary | ICD-10-CM

## 2024-02-07 DIAGNOSIS — M79674 Pain in right toe(s): Secondary | ICD-10-CM

## 2024-02-07 DIAGNOSIS — M79675 Pain in left toe(s): Secondary | ICD-10-CM

## 2024-02-07 NOTE — Progress Notes (Signed)
This patient returns to my office for at risk foot care.  This patient requires this care by a professional since this patient will be at risk due to having diabetes mellitus.    This patient is unable to cut nails himself since the patient cannot reach his nails.These nails are painful walking and wearing shoes.   Patient has painful callus under the forefoot  both feet.  The callus is painful walking and wearing his shoes. Patient is accompanied by male caregiver.This patient presents for at risk foot care today.  General Appearance  Alert, conversant and in no acute stress.  Vascular  Dorsalis pedis and posterior tibial  pulses are palpable  bilaterally.  Capillary return is within normal limits  bilaterally. Temperature is within normal limits  bilaterally.  Neurologic  Senn-Weinstein monofilament wire test within normal limits  bilaterally. Muscle power within normal limits bilaterally.  Nails Thick disfigured discolored nails with subungual debris  from hallux to fifth toes bilaterally. No evidence of bacterial infection or drainage bilaterally.  Orthopedic  No limitations of motion  feet .  No crepitus or effusions noted.  No bony pathology or digital deformities noted.  Skin  normotropic skin noted bilaterally.  No signs of infections or ulcers noted.   Porokeratotic lesions sub 1/4 B/L.  Onychomycosis  Porokeratosis  B/L.  Consent was obtained for treatment procedures.  Debride callus with # 15 blade. Nails performed as a courtesy. Filed with dremel without incident.    Return office visit   10 weeks                 Told patient to return for periodic foot care and evaluation due to potential at risk complications.   Helane Gunther DPM

## 2024-04-08 ENCOUNTER — Ambulatory Visit: Admitting: Podiatry

## 2024-04-08 ENCOUNTER — Encounter: Payer: Self-pay | Admitting: Podiatry

## 2024-04-08 DIAGNOSIS — Q828 Other specified congenital malformations of skin: Secondary | ICD-10-CM | POA: Diagnosis not present

## 2024-04-08 DIAGNOSIS — L84 Corns and callosities: Secondary | ICD-10-CM

## 2024-04-08 DIAGNOSIS — E1169 Type 2 diabetes mellitus with other specified complication: Secondary | ICD-10-CM | POA: Diagnosis not present

## 2024-04-08 NOTE — Progress Notes (Signed)
 This patient returns to my office for at risk foot care.  This patient requires this care by a professional since this patient will be at risk due to having diabetes mellitus.    This patient is unable to cut nails himself since the patient cannot reach his nails.These nails are painful walking and wearing shoes.   Patient has painful callus under the forefoot  both feet.  The callus is painful walking and wearing his shoes. Patient is accompanied by male caregiver.This patient presents for at risk foot care today.  General Appearance  Alert, conversant and in no acute stress.  Vascular  Dorsalis pedis and posterior tibial  pulses are palpable  bilaterally.  Capillary return is within normal limits  bilaterally. Temperature is within normal limits  bilaterally.  Neurologic  Senn-Weinstein monofilament wire test within normal limits  bilaterally. Muscle power within normal limits bilaterally.  Nails Thick disfigured discolored nails with subungual debris  from hallux to fifth toes bilaterally. No evidence of bacterial infection or drainage bilaterally.  Orthopedic  No limitations of motion  feet .  No crepitus or effusions noted.  No bony pathology or digital deformities noted.  Skin  normotropic skin noted bilaterally.  No signs of infections or ulcers noted.   Porokeratotic lesions sub 1/4 B/L.  Onychomycosis  Porokeratosis  B/L.  Consent was obtained for treatment procedures.  Debride callus with # 15 blade. Nails performed as a courtesy. Filed with dremel without incident.    Return office visit   9  weeks                 Told patient to return for periodic foot care and evaluation due to potential at risk complications.   Ruffin Cotton DPM

## 2024-04-10 ENCOUNTER — Ambulatory Visit: Admitting: Podiatry

## 2024-04-17 DIAGNOSIS — E559 Vitamin D deficiency, unspecified: Secondary | ICD-10-CM | POA: Diagnosis not present

## 2024-04-17 DIAGNOSIS — E039 Hypothyroidism, unspecified: Secondary | ICD-10-CM | POA: Diagnosis not present

## 2024-04-17 DIAGNOSIS — E1169 Type 2 diabetes mellitus with other specified complication: Secondary | ICD-10-CM | POA: Diagnosis not present

## 2024-04-17 DIAGNOSIS — E782 Mixed hyperlipidemia: Secondary | ICD-10-CM | POA: Diagnosis not present

## 2024-04-23 DIAGNOSIS — C61 Malignant neoplasm of prostate: Secondary | ICD-10-CM | POA: Diagnosis not present

## 2024-04-23 DIAGNOSIS — N401 Enlarged prostate with lower urinary tract symptoms: Secondary | ICD-10-CM | POA: Diagnosis not present

## 2024-04-23 DIAGNOSIS — E782 Mixed hyperlipidemia: Secondary | ICD-10-CM | POA: Diagnosis not present

## 2024-04-23 DIAGNOSIS — E039 Hypothyroidism, unspecified: Secondary | ICD-10-CM | POA: Diagnosis not present

## 2024-04-23 DIAGNOSIS — E1169 Type 2 diabetes mellitus with other specified complication: Secondary | ICD-10-CM | POA: Diagnosis not present

## 2024-04-23 DIAGNOSIS — I10 Essential (primary) hypertension: Secondary | ICD-10-CM | POA: Diagnosis not present

## 2024-04-23 DIAGNOSIS — F4310 Post-traumatic stress disorder, unspecified: Secondary | ICD-10-CM | POA: Diagnosis not present

## 2024-04-23 DIAGNOSIS — E1159 Type 2 diabetes mellitus with other circulatory complications: Secondary | ICD-10-CM | POA: Diagnosis not present

## 2024-04-23 DIAGNOSIS — F411 Generalized anxiety disorder: Secondary | ICD-10-CM | POA: Diagnosis not present

## 2024-06-03 DIAGNOSIS — C61 Malignant neoplasm of prostate: Secondary | ICD-10-CM | POA: Diagnosis not present

## 2024-06-10 ENCOUNTER — Encounter: Payer: Self-pay | Admitting: Podiatry

## 2024-06-10 ENCOUNTER — Ambulatory Visit: Admitting: Podiatry

## 2024-06-10 DIAGNOSIS — M79675 Pain in left toe(s): Secondary | ICD-10-CM | POA: Diagnosis not present

## 2024-06-10 DIAGNOSIS — E1169 Type 2 diabetes mellitus with other specified complication: Secondary | ICD-10-CM

## 2024-06-10 DIAGNOSIS — M79674 Pain in right toe(s): Secondary | ICD-10-CM | POA: Diagnosis not present

## 2024-06-10 DIAGNOSIS — N401 Enlarged prostate with lower urinary tract symptoms: Secondary | ICD-10-CM | POA: Diagnosis not present

## 2024-06-10 DIAGNOSIS — R351 Nocturia: Secondary | ICD-10-CM | POA: Diagnosis not present

## 2024-06-10 DIAGNOSIS — B351 Tinea unguium: Secondary | ICD-10-CM

## 2024-06-10 DIAGNOSIS — Q828 Other specified congenital malformations of skin: Secondary | ICD-10-CM | POA: Diagnosis not present

## 2024-06-10 DIAGNOSIS — C61 Malignant neoplasm of prostate: Secondary | ICD-10-CM | POA: Diagnosis not present

## 2024-06-10 NOTE — Progress Notes (Signed)
 This patient returns to my office for at risk foot care.  This patient requires this care by a professional since this patient will be at risk due to having diabetes mellitus.    This patient is unable to cut nails himself since the patient cannot reach his nails.These nails are painful walking and wearing shoes.   Patient has painful callus under the forefoot  both feet.  The callus is painful walking and wearing his shoes. Patient is accompanied by male caregiver.This patient presents for at risk foot care today.  General Appearance  Alert, conversant and in no acute stress.  Vascular  Dorsalis pedis and posterior tibial  pulses are palpable  bilaterally.  Capillary return is within normal limits  bilaterally. Temperature is within normal limits  bilaterally.  Neurologic  Senn-Weinstein monofilament wire test within normal limits  bilaterally. Muscle power within normal limits bilaterally.  Nails Thick disfigured discolored nails with subungual debris  from hallux to fifth toes bilaterally. No evidence of bacterial infection or drainage bilaterally.  Orthopedic  No limitations of motion  feet .  No crepitus or effusions noted.  No bony pathology or digital deformities noted.  Skin  normotropic skin noted bilaterally.  No signs of infections or ulcers noted.   Porokeratotic lesions sub 1/4 B/L.  Onychomycosis  Porokeratosis  B/L.  Consent was obtained for treatment procedures.  Debride callus with # 15 blade. Nails performed as a courtesy. Filed with dremel without incident.    Return office visit   9  weeks                 Told patient to return for periodic foot care and evaluation due to potential at risk complications. I told the patient to see Dr.  Tobie about a surgical consultation.   Cordella Bold DPM

## 2024-07-14 ENCOUNTER — Ambulatory Visit: Admitting: Podiatry

## 2024-08-05 DIAGNOSIS — E559 Vitamin D deficiency, unspecified: Secondary | ICD-10-CM | POA: Diagnosis not present

## 2024-08-05 DIAGNOSIS — E1169 Type 2 diabetes mellitus with other specified complication: Secondary | ICD-10-CM | POA: Diagnosis not present

## 2024-08-05 DIAGNOSIS — E039 Hypothyroidism, unspecified: Secondary | ICD-10-CM | POA: Diagnosis not present

## 2024-08-05 DIAGNOSIS — E782 Mixed hyperlipidemia: Secondary | ICD-10-CM | POA: Diagnosis not present

## 2024-08-12 DIAGNOSIS — Z23 Encounter for immunization: Secondary | ICD-10-CM | POA: Diagnosis not present

## 2024-08-12 DIAGNOSIS — E1169 Type 2 diabetes mellitus with other specified complication: Secondary | ICD-10-CM | POA: Diagnosis not present

## 2024-08-12 DIAGNOSIS — F411 Generalized anxiety disorder: Secondary | ICD-10-CM | POA: Diagnosis not present

## 2024-08-12 DIAGNOSIS — E782 Mixed hyperlipidemia: Secondary | ICD-10-CM | POA: Diagnosis not present

## 2024-08-12 DIAGNOSIS — I10 Essential (primary) hypertension: Secondary | ICD-10-CM | POA: Diagnosis not present

## 2024-08-12 DIAGNOSIS — C61 Malignant neoplasm of prostate: Secondary | ICD-10-CM | POA: Diagnosis not present

## 2024-08-12 DIAGNOSIS — E039 Hypothyroidism, unspecified: Secondary | ICD-10-CM | POA: Diagnosis not present

## 2024-08-12 DIAGNOSIS — N401 Enlarged prostate with lower urinary tract symptoms: Secondary | ICD-10-CM | POA: Diagnosis not present

## 2024-08-12 DIAGNOSIS — F4310 Post-traumatic stress disorder, unspecified: Secondary | ICD-10-CM | POA: Diagnosis not present

## 2024-11-17 ENCOUNTER — Ambulatory Visit: Admitting: Podiatry

## 2024-11-17 DIAGNOSIS — B351 Tinea unguium: Secondary | ICD-10-CM

## 2024-11-17 DIAGNOSIS — M79674 Pain in right toe(s): Secondary | ICD-10-CM

## 2024-11-17 DIAGNOSIS — M79675 Pain in left toe(s): Secondary | ICD-10-CM

## 2024-11-17 DIAGNOSIS — E1169 Type 2 diabetes mellitus with other specified complication: Secondary | ICD-10-CM

## 2024-11-17 DIAGNOSIS — Q828 Other specified congenital malformations of skin: Secondary | ICD-10-CM

## 2024-11-17 NOTE — Progress Notes (Signed)
 This patient returns to my office for at risk foot care.  This patient requires this care by a professional since this patient will be at risk due to having diabetes mellitus.    This patient is unable to cut nails himself since the patient cannot reach his nails.These nails are painful walking and wearing shoes.   Patient has painful callus under the forefoot  both feet.  The callus is painful walking and wearing his shoes. Patient is accompanied by male caregiver.This patient presents for at risk foot care today.  General Appearance  Alert, conversant and in no acute stress.  Vascular  Dorsalis pedis and posterior tibial  pulses are palpable  bilaterally.  Capillary return is within normal limits  bilaterally. Temperature is within normal limits  bilaterally.  Neurologic  Senn-Weinstein monofilament wire test within normal limits  bilaterally. Muscle power within normal limits bilaterally.  Nails Thick disfigured discolored nails with subungual debris  from hallux to fifth toes bilaterally. No evidence of bacterial infection or drainage bilaterally.  Orthopedic  No limitations of motion  feet .  No crepitus or effusions noted.  No bony pathology or digital deformities noted.  Skin  normotropic skin noted bilaterally.  No signs of infections or ulcers noted.   Porokeratotic lesions sub 1/4 B/L.  Onychomycosis  Porokeratosis  B/L.  Consent was obtained for treatment procedures.  Debride callus with # 15 blade. Nails performed as a courtesy. Filed with dremel without incident.    Return office visit  3 months                 Told patient to return for periodic foot care and evaluation due to potential at risk complications. Callus ae less symptomatic due to moisturizing his feet.   Cordella Bold DPM

## 2025-02-05 ENCOUNTER — Ambulatory Visit: Admitting: Podiatry
